# Patient Record
Sex: Female | Born: 1980 | Race: White | Hispanic: Yes | Marital: Married | Smoking: Never smoker
Health system: Southern US, Community
[De-identification: ages and names within clinical notes are randomized; demographics above are authoritative.]

## PROBLEM LIST (undated history)

## (undated) DIAGNOSIS — G8929 Other chronic pain: Secondary | ICD-10-CM

## (undated) DIAGNOSIS — Z789 Other specified health status: Secondary | ICD-10-CM

## (undated) HISTORY — DX: Other specified health status: Z78.9

## (undated) HISTORY — PX: NO PAST SURGERIES: SHX2092

## (undated) HISTORY — DX: Other chronic pain: G89.29

---

## 2008-02-05 ENCOUNTER — Ambulatory Visit (HOSPITAL_COMMUNITY): Admission: RE | Admit: 2008-02-05 | Discharge: 2008-02-05 | Payer: Self-pay | Admitting: Family Medicine

## 2008-07-06 ENCOUNTER — Ambulatory Visit (HOSPITAL_COMMUNITY): Admission: RE | Admit: 2008-07-06 | Discharge: 2008-07-06 | Payer: Self-pay | Admitting: Obstetrics & Gynecology

## 2008-07-11 ENCOUNTER — Ambulatory Visit: Payer: Self-pay | Admitting: Obstetrics and Gynecology

## 2008-07-11 ENCOUNTER — Inpatient Hospital Stay (HOSPITAL_COMMUNITY): Admission: AD | Admit: 2008-07-11 | Discharge: 2008-07-14 | Payer: Self-pay | Admitting: Obstetrics & Gynecology

## 2010-04-25 LAB — RPR: RPR Ser Ql: NONREACTIVE

## 2010-04-25 LAB — CBC
HCT: 39.1 % (ref 36.0–46.0)
Hemoglobin: 13.7 g/dL (ref 12.0–15.0)
MCV: 96 fL (ref 78.0–100.0)
MCV: 97.4 fL (ref 78.0–100.0)
RBC: 3.37 MIL/uL — ABNORMAL LOW (ref 3.87–5.11)
RBC: 4.07 MIL/uL (ref 3.87–5.11)
WBC: 14.9 10*3/uL — ABNORMAL HIGH (ref 4.0–10.5)
WBC: 7.9 10*3/uL (ref 4.0–10.5)

## 2011-09-25 ENCOUNTER — Ambulatory Visit: Payer: Self-pay | Admitting: Family Medicine

## 2011-09-25 VITALS — BP 98/56 | HR 90 | Temp 98.3°F | Resp 20 | Ht 63.0 in | Wt 124.0 lb

## 2011-09-25 DIAGNOSIS — S161XXA Strain of muscle, fascia and tendon at neck level, initial encounter: Secondary | ICD-10-CM

## 2011-09-25 DIAGNOSIS — S139XXA Sprain of joints and ligaments of unspecified parts of neck, initial encounter: Secondary | ICD-10-CM

## 2011-09-25 MED ORDER — CYCLOBENZAPRINE HCL 10 MG PO TABS: 10.0000 mg | ORAL_TABLET | Freq: Two times a day (BID) | ORAL | Status: AC | PRN

## 2011-09-25 NOTE — Progress Notes (Signed)
[facility] [address], [place] [address] [phone]  Date:  09/25/2011   Name:  Betty Bates   DOB:  [birth year]   MRN:  [id number]  PCP:  No primary provider on file.    Chief Complaint: Headache   History of Present Illness:  Betty Bates is a 31 y.o. very pleasant female patient who presents with the following:  She has noted pain in the back of her head for about 10 days.  She tried some OTC pain relievers, but these did not seem to help.  Her neck does not hurt.  She has not noted a cough, ST, or fever.  The symptoms wax and wane- she will notice return of her pain in the morning after she gets up.  The pain is not very intense, but it is persistent.   She has never had this before.  She has noted some pressure in her ears, but no changes in her vision or hearing.  No nausea or vomiting.    She has been "frustrated" as she has not been able to achieve orgasm with her BF for the last several months.  She wonders if this is contributing to her headache.    There is no problem list on file for this patient.   No past medical history on file.  No past surgical history on file.  History  Substance Use Topics  . Smoking status: Never Smoker   . Smokeless tobacco: Not on file  . Alcohol Use: Not on file    No family history on file.  Not on File  Medication list has been reviewed and updated.  No current outpatient prescriptions on file prior to visit.    Review of Systems:  As per HPI- otherwise negative.   Physical Examination: Filed Vitals:   09/25/11 1405  BP: 98/56  Pulse: 116  Temp: 98.3 F (36.8 C)  Resp: 20   Filed Vitals:   09/25/11 1405  Height: 5\' 3"  (1.6 m)  Weight: 124 lb (56.246 kg)   Body mass index is 21.97 kg/(m^2). Ideal Body Weight: Weight in (lb) to have BMI = 25: 140.8   GEN: WDWN, NAD, Non-toxic, A & O x 3, looks well HEENT: Atraumatic, Normocephalic. Neck supple. No masses, No LAD.   PEERL, EOMI. Oropharynx wnl, TM wnl bilaterally.  No nodes in her neck- anterior or posterior.  She is slightly tender at the muscles at the base of her skull- right more than left. Full cervical ROM is preserved.    Ears and Nose: No external deformity. CV: RRR, No M/G/R. No JVD. No thrill. No extra heart sounds. PULM: CTA B, no wheezes, crackles, rhonchi. No retractions. No resp. distress. No accessory muscle use. EXTR: No c/c/e NEURO Normal gait.   Normal strength, sensation and DTR of all extremities.  Negative arm drift.   PSYCH: Normally interactive. Conversant. Not depressed or anxious appearing.  Calm demeanor.    Assessment and Plan: 1. Neck strain  cyclobenzaprine (FLEXERIL) 10 MG tablet   Betty Bates seems to have a strain of the muscles in the back of her neck/ skull base.  Will try flexeril as needed- be cautious of sedation and avoid use while driving.  She is to let me know if she is not better in the next few days- Sooner if worse.   Discussed her other problems as well.  Try conservative therapy such as relaxation, etc first.   If not helpful we can try  hormone levels, but she has no insurance coverage so will defer for now.    Abbe Amsterdam, MD

## 2012-06-26 ENCOUNTER — Ambulatory Visit: Payer: Self-pay | Admitting: Physician Assistant

## 2012-06-26 VITALS — BP 102/62 | HR 82 | Temp 98.4°F | Resp 18 | Ht 63.0 in | Wt 130.0 lb

## 2012-06-26 DIAGNOSIS — N912 Amenorrhea, unspecified: Secondary | ICD-10-CM

## 2012-06-26 LAB — POCT URINE PREGNANCY: Preg Test, Ur: POSITIVE

## 2012-06-26 NOTE — Progress Notes (Signed)
  Subjective:    Patient ID: Betty Bates, female    DOB: 11/06/1980, 32 y.o.   MRN: [id number]  HPI   Betty Bates is a very pleasant 32 yr old female here requesting a pregnancy test.  Had a positive home pregnancy test about a month and a half ago.  Needs letter with pos result and EDC.  States that she was trying to conceive.  Does not have OB/GYN at this point, but trying to establish.  Thinks LMP 04/12/12, but periods are irregular so she is not sure.  1 prior pregnancy in 2010 with no complications.  She does not smoke and is not currently drinking alcohol.  She is taking prenatal vitamins daily.  Endorses some nausea, heartburn, and fatigue, but otherwise is feeling well.  Denies vaginal discharge or bleeding.     Review of Systems  Constitutional: Positive for fatigue. Negative for fever, chills and appetite change.  HENT: Negative.   Respiratory: Negative.   Cardiovascular: Negative.   Gastrointestinal: Positive for nausea. Negative for vomiting.  Genitourinary: Positive for menstrual problem (amenorrhea). Negative for vaginal bleeding and vaginal discharge.  Musculoskeletal: Negative.   Skin: Negative.   Neurological: Negative.        Objective:   Physical Exam  Vitals reviewed. Constitutional: She is oriented to person, place, and time. She appears well-developed and well-nourished. No distress.  HENT:  Head: Normocephalic and atraumatic.  Eyes: Conjunctivae are normal. No scleral icterus.  Cardiovascular: Normal rate, regular rhythm and normal heart sounds.   Pulmonary/Chest: Effort normal and breath sounds normal. She has no wheezes. She has no rales.  Abdominal: Soft. There is no tenderness.  Neurological: She is alert and oriented to person, place, and time.  Skin: Skin is warm and dry.  Psychiatric: She has a normal mood and affect. Her behavior is normal.     Results for orders placed in visit on 06/26/12  POCT URINE PREGNANCY      Result Value  Range   Preg Test, Ur Positive         Assessment & Plan:  Amenorrhea - Plan: POCT urine pregnancy   Betty Bates is a very pleasant 32 yr old female here with amenorrhea.  LMP estimated to be 04/12/12.  Urine preg pos today in clinic.  Estimated to be 10wks 5days by dates, making [facility] 01/17/13.  Anticipatory guidance.  Encouraged pt to continue prenatal vitamins, healthy dietary choices.  Avoid tobacco, etoh.  RTC precautions regarding bleeding, pain.  Encouraged pt to establish with OB/GYN within the next 2-3 wks for routine prenatal care.  Letter provided with lab results and EDC
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
## 2012-07-11 ENCOUNTER — Other Ambulatory Visit: Payer: Self-pay

## 2012-07-11 DIAGNOSIS — Z331 Pregnant state, incidental: Secondary | ICD-10-CM

## 2012-07-11 NOTE — Progress Notes (Signed)
PRENATAL LABS DONE TODAY Avantika Shere 

## 2012-07-12 LAB — OBSTETRIC PANEL
Antibody Screen: NEGATIVE
Basophils Relative: 0 % (ref 0–1)
HCT: 39.7 % (ref 36.0–46.0)
Hemoglobin: 13.5 g/dL (ref 12.0–15.0)
Lymphocytes Relative: 16 % (ref 12–46)
MCHC: 34 g/dL (ref 30.0–36.0)
Monocytes Absolute: 0.6 10*3/uL (ref 0.1–1.0)
Monocytes Relative: 6 % (ref 3–12)
Neutro Abs: 7.4 10*3/uL (ref 1.7–7.7)
Rh Type: POSITIVE
Rubella: 23 Index — ABNORMAL HIGH (ref <0.90)

## 2012-07-18 ENCOUNTER — Encounter: Payer: Self-pay | Admitting: Family Medicine

## 2012-07-18 ENCOUNTER — Ambulatory Visit (INDEPENDENT_AMBULATORY_CARE_PROVIDER_SITE_OTHER): Payer: Self-pay | Admitting: Family Medicine

## 2012-07-18 VITALS — BP 112/68 | Wt 130.0 lb

## 2012-07-18 DIAGNOSIS — Z3480 Encounter for supervision of other normal pregnancy, unspecified trimester: Secondary | ICD-10-CM

## 2012-07-18 NOTE — Progress Notes (Signed)
32 year old G2P1001 @ 15w 0d by LMP who presents for initial OB visit. Complains of lower back pain, nausea, and intermittent headache today.   O: Blood pressure 112/68, weight 130 lb (58.968 kg), last menstrual period 04/12/2012. Gen: middle aged Hispanic female, well appearing, English speaking HEENT: NCAT, PERRLA CV: RRR, no murmurs Pulm: CTA-B Abd: soft, ND Extremities: no edema MSK: negative straight leg raise and Faber test's bilaterally, no tenderness of spine  GU: External: no lesions Vagina: no blood in vault Cervix: no lesion; no mucopurulent d/c Uterus: small, mobile Adnexa: no masses; non tender   PHQ-9: 57  A/P: 32 year old G2P1001 at first prenatal visit - No indication for early glucola - Concern about exposure to herpes, so will test HSV2 Ab, encouarged abstinence with husband and counseled on TORCH infections - Uncertain of varicella immunity, so check titer  - Genetic screening: draw quad screen - F/u in 4 weeks

## 2012-07-18 NOTE — Patient Instructions (Signed)
Thank you coming to see me today. I am excited to be your doctor.   For you nausea, I recommend ginger tablets or vitamin B6 tabs. These can be purchased at any pharmacy.   For back ache, take tylenol, using a heating pad and rest when possible.   For headaches, tylenol and drinking lots of water will help.   Come back in 2 weeks for a lab visit. Come back in 4 weeks for a visit with me.   Sincerely,   Dr. Clinton Sawyer [phone]

## 2012-07-23 ENCOUNTER — Encounter: Payer: Self-pay | Admitting: Family Medicine

## 2012-08-01 ENCOUNTER — Ambulatory Visit (INDEPENDENT_AMBULATORY_CARE_PROVIDER_SITE_OTHER): Payer: Self-pay | Admitting: Family Medicine

## 2012-08-01 VITALS — BP 101/66 | Wt 133.0 lb

## 2012-08-01 DIAGNOSIS — Z3480 Encounter for supervision of other normal pregnancy, unspecified trimester: Secondary | ICD-10-CM

## 2012-08-01 NOTE — Progress Notes (Signed)
32 year old G2P1001 @ [redacted]w[redacted]d EGA by LMP (which is very uncertain dating) who presents for a lab visit. She is having no problems and does not need to be seen for another two weeks. Today we will obtain quad screen, HSV2 ab, and varicella titer.

## 2012-08-07 NOTE — Progress Notes (Signed)
Chart and labs reviewed, I agree with Dr Yetta Numbers assess/plan. Patient with prior delivery just shy of LGA (3.9kg); decision to defer 1hrGTT until 24-[redacted] weeks EGA unless other indication (ie, first degree relative with DM).  Anatomy scan at 18-20 weeks. JB

## 2012-08-08 ENCOUNTER — Encounter: Payer: Self-pay | Admitting: Family Medicine

## 2012-08-08 DIAGNOSIS — Z3490 Encounter for supervision of normal pregnancy, unspecified, unspecified trimester: Secondary | ICD-10-CM | POA: Insufficient documentation

## 2012-08-19 ENCOUNTER — Ambulatory Visit (INDEPENDENT_AMBULATORY_CARE_PROVIDER_SITE_OTHER): Payer: Self-pay | Admitting: Family Medicine

## 2012-08-19 VITALS — BP 94/64 | Wt 136.5 lb

## 2012-08-19 DIAGNOSIS — Z3480 Encounter for supervision of other normal pregnancy, unspecified trimester: Secondary | ICD-10-CM

## 2012-08-19 DIAGNOSIS — Z3492 Encounter for supervision of normal pregnancy, unspecified, second trimester: Secondary | ICD-10-CM

## 2012-08-19 NOTE — Patient Instructions (Signed)
Thank you for coming in today. Please call back tomorrow or Wednesday for a referral for the ultrasound, depending on whether you receive the orange card. Also, please look at the website below for any interest in baby classes.   [email or web address]  At your next visit, please follow up in the [facility] clinic with Dr. Mauricio Po or Dr. Lum Babe. This will be in 4 weeks. Then follow with me afterwards.   Sincerely,   Dr. Clinton Sawyer

## 2012-08-19 NOTE — Progress Notes (Signed)
32 year old G2P1001 @ [redacted]w[redacted]d who present for routine prenatal care.   O: see vitals A/P:  - Patient deferred ultrasound until application for orange care completed, which is tomorrow, she will call back if appt for u/s at Dr. Elsie Stain office needed - Discussed prenatal classes and encouraged exercise - F/u in 4 weeks with OB clinic

## 2012-08-23 ENCOUNTER — Telehealth: Payer: Self-pay | Admitting: Family Medicine

## 2012-08-23 DIAGNOSIS — Z3492 Encounter for supervision of normal pregnancy, unspecified, second trimester: Secondary | ICD-10-CM

## 2012-08-23 NOTE — Telephone Encounter (Signed)
Pt was told by Dr. Clinton Sawyer to choose a place to have here ultra sound done. She would like to go to [facility] and would like to appointment on 8/26 if possible. JW

## 2012-08-23 NOTE — Telephone Encounter (Signed)
Will fwd to PCP for order. Lorenda Hatchet, Renato Battles

## 2012-08-26 NOTE — Telephone Encounter (Signed)
Attempted to call patient about ultrasound since it would be ideal to have it performed before 09/10/12. Also, it would be less expensive to go to Dr. Elsie Stain office.

## 2012-08-29 NOTE — Addendum Note (Signed)
Addended by: Garnetta Buddy on: 08/29/2012 08:52 AM   Modules accepted: Orders

## 2012-08-29 NOTE — Telephone Encounter (Signed)
Patient said she is going to get her Erskine Emery Card next week and would like an appointment for an ultrasound at [facility] after 8/22 in the afternoon. I will place the order and have our CMA [facility] to [facility] up and then let the patient know the time and date.

## 2012-09-02 ENCOUNTER — Ambulatory Visit: Payer: Self-pay

## 2012-09-09 ENCOUNTER — Ambulatory Visit (HOSPITAL_COMMUNITY)
Admission: RE | Admit: 2012-09-09 | Discharge: 2012-09-09 | Disposition: A | Discharge status: Home / Self Care | Payer: Self-pay | Source: Ambulatory Visit | Attending: Family Medicine | Admitting: Family Medicine

## 2012-09-09 ENCOUNTER — Encounter: Payer: Self-pay | Admitting: Family Medicine

## 2012-09-09 ENCOUNTER — Telehealth: Payer: Self-pay | Admitting: Family Medicine

## 2012-09-09 DIAGNOSIS — Z3492 Encounter for supervision of normal pregnancy, unspecified, second trimester: Secondary | ICD-10-CM

## 2012-09-09 DIAGNOSIS — Z3689 Encounter for other specified antenatal screening: Secondary | ICD-10-CM | POA: Insufficient documentation

## 2012-09-09 NOTE — Telephone Encounter (Signed)
Called patient and discussed results of ultrasound. She acknowledged understanding.

## 2012-09-26 ENCOUNTER — Ambulatory Visit (INDEPENDENT_AMBULATORY_CARE_PROVIDER_SITE_OTHER): Payer: No Typology Code available for payment source | Admitting: Family Medicine

## 2012-09-26 VITALS — BP 96/65 | Wt 141.9 lb

## 2012-09-26 DIAGNOSIS — Z3492 Encounter for supervision of normal pregnancy, unspecified, second trimester: Secondary | ICD-10-CM

## 2012-09-26 DIAGNOSIS — Z23 Encounter for immunization: Secondary | ICD-10-CM

## 2012-09-26 DIAGNOSIS — Z3480 Encounter for supervision of other normal pregnancy, unspecified trimester: Secondary | ICD-10-CM

## 2012-09-26 LAB — GLUCOSE, CAPILLARY
Comment 1: 1
Glucose-Capillary: 140 mg/dL — ABNORMAL HIGH (ref 70–99)

## 2012-09-26 NOTE — Progress Notes (Signed)
Patient seen in [facility], visit conducted in both [place] and [place].  Patient is G2P1 @[redacted]w[redacted]d , with EDD of 01/09/2013.  She reports ample fetal movement, denies LOF, denies vaginal discharge or bleeding.  She gets leg cramps at night sometimes, which are better with stretching.  Works on her feet as a Conservation officer, nature.  Nonsmoker.  Plans to breastfeed.  Has had very rare mild contractions from time to time. Family Hx: Paternal uncles with DM, no other family with DM.  Assess/Plan: To have 1hGTT today; we are giving flu shot today as well. Will need Tdap after 27 weeks' gestation.  Counseled on preterm labor, kick counts. Next visit in 3-4 weeks, then every 2 weeks from 28-36 weeks, then weekly.  Paula Compton, MD

## 2012-09-26 NOTE — Patient Instructions (Addendum)
Fue un Research officer, trade union en OB Clinic.  Esta' con 25 semanas de embarazo hoy.   Estamos haciendo la prueba del IT consultant.    [phone]  Proxima cita en 4 semanas con el Dr Clinton Sawyer. FOLLOW UP PRENATAL VISIT IN 4 WEEKS WITH DR Clinton Sawyer.   Prevencin de Sport and exercise psychologist (Preventing Preterm Labor) Un parto prematuro ocurre cuando la mujer embarazada tiene contracciones uterinas que causan la apertura, el acortamiento y el afinamiento del cuello del tero, antes de las 37 semanas de [place]. Tendr contracciones regulares cada 2 a 3 minutos. Esto generalmente causa molestias o dolor. CUIDADOS EN EL HOGAR  Consuma una dieta saludable.  [employer] las vitaminas segn le haya indicado el mdico.  Beba una cantidad de lquido suficiente como para Pharmacologist la orina de tono claro o color amarillo plido todos [place].  Descanse y duerma.  No tenga relaciones sexuales si tiene un parto prematuro o alto riesgo de tenerlo.  Siga las instrucciones del mdico acerca de su [place], los medicamentos y los exmenes.  Evite el estrs.  Evite los esfuerzos extenuantes o la actividad fsica extensa.  No fume. SOLICITE AYUDA DE INMEDIATO SI:   Tiene contracciones.  Siente dolor abdominal.  Tiene sangrado que proviene de la vagina.  Siente dolor al [employer].  Observa una secrecin anormal que proviene de la vagina.  Tiene una temperatura oral de ms de 38,9 C (102 F). ASEGRESE DE QUE:  Comprende estas instrucciones.  Controlar su enfermedad.  Solicitar ayuda si no mejora o si empeora. Document Released: 02/04/2010 Document Revised: 03/27/2011 [facility] Patient Information 2014 [place], [place].

## 2012-10-02 ENCOUNTER — Other Ambulatory Visit (INDEPENDENT_AMBULATORY_CARE_PROVIDER_SITE_OTHER): Payer: No Typology Code available for payment source

## 2012-10-02 DIAGNOSIS — Z331 Pregnant state, incidental: Secondary | ICD-10-CM

## 2012-10-02 NOTE — Progress Notes (Signed)
3 HR GTT DONE TODAY Alla Sloma 

## 2012-10-03 LAB — GLUCOSE TOLERANCE, 3 HOURS
Glucose Tolerance, 1 hour: 104 mg/dL (ref 70–189)
Glucose Tolerance, Fasting: 79 mg/dL (ref 70–104)

## 2012-10-23 ENCOUNTER — Ambulatory Visit (INDEPENDENT_AMBULATORY_CARE_PROVIDER_SITE_OTHER): Payer: No Typology Code available for payment source | Admitting: Family Medicine

## 2012-10-23 ENCOUNTER — Encounter: Payer: No Typology Code available for payment source | Admitting: Family Medicine

## 2012-10-23 VITALS — BP 105/72 | Wt 144.0 lb

## 2012-10-23 DIAGNOSIS — Z3400 Encounter for supervision of normal first pregnancy, unspecified trimester: Secondary | ICD-10-CM

## 2012-10-23 DIAGNOSIS — Z3403 Encounter for supervision of normal first pregnancy, third trimester: Secondary | ICD-10-CM

## 2012-10-23 DIAGNOSIS — Z3480 Encounter for supervision of other normal pregnancy, unspecified trimester: Secondary | ICD-10-CM

## 2012-10-23 DIAGNOSIS — Z3493 Encounter for supervision of normal pregnancy, unspecified, third trimester: Secondary | ICD-10-CM

## 2012-10-23 DIAGNOSIS — Z331 Pregnant state, incidental: Secondary | ICD-10-CM

## 2012-10-23 MED ORDER — TETANUS-DIPHTH-ACELL PERTUSSIS 5-2.5-18.5 LF-MCG/0.5 IM SUSP: 0.5000 mL | Freq: Once | INTRAMUSCULAR | Status: DC

## 2012-10-23 NOTE — Patient Instructions (Signed)
I am glad that you are doing well. The baby seems very healthy today.   Please go to [facility] for the following reasons:   You do not have 5 or more contraction in one hours Bleeding or a large amount of fluid coming from you vagina You do not feel the baby move more than 10 times in 2 hours   Please follow up in 2 weeks.   Sincerely,   Dr. Clinton Sawyer

## 2012-10-23 NOTE — Progress Notes (Signed)
32 year old F G2P1001 @ [redacted]w[redacted]d by LMP who presents for rountine prenatal care. No complaints.   Vitals - see flow sheet, all WNL A/P: - Given TDAP today - Check CBC, RPR, and HIV today - Counseled on preterm labor precautions - F/u in 2 weeks

## 2012-10-24 ENCOUNTER — Encounter: Payer: Self-pay | Admitting: Family Medicine

## 2012-10-24 LAB — CBC
HCT: 35.9 % — ABNORMAL LOW (ref 36.0–46.0)
Hemoglobin: 12.1 g/dL (ref 12.0–15.0)
MCH: 29.2 pg (ref 26.0–34.0)
MCHC: 33.7 g/dL (ref 30.0–36.0)

## 2012-11-06 ENCOUNTER — Encounter: Payer: No Typology Code available for payment source | Admitting: Family Medicine

## 2012-11-06 ENCOUNTER — Ambulatory Visit (INDEPENDENT_AMBULATORY_CARE_PROVIDER_SITE_OTHER): Payer: No Typology Code available for payment source | Admitting: Family Medicine

## 2012-11-06 VITALS — BP 100/66 | Wt 145.5 lb

## 2012-11-06 DIAGNOSIS — Z3493 Encounter for supervision of normal pregnancy, unspecified, third trimester: Secondary | ICD-10-CM

## 2012-11-06 DIAGNOSIS — Z3480 Encounter for supervision of other normal pregnancy, unspecified trimester: Secondary | ICD-10-CM

## 2012-11-06 NOTE — Patient Instructions (Signed)
It was nice to see you today. Everything looks great. Please follow up in two weeks .  Please go to [facility] for the following reasons:   You do have 5 or more contraction in one hour Bleeding or a large amount of fluid coming from you vagina You do not feel the baby move more than 10 times in 2 hours  Sincerely,   Dr. Clinton Sawyer

## 2012-11-06 NOTE — Progress Notes (Signed)
32 year old F G2P1001 @ [redacted]w[redacted]d by LMP presents for routine prenatal today.  O: see flow sheet A/P: - Discussed negative labs from last visit - Patient denies wanting contraception after pregnancy - Plans to breast feed - F/u in 2 weeks

## 2012-11-20 ENCOUNTER — Ambulatory Visit (INDEPENDENT_AMBULATORY_CARE_PROVIDER_SITE_OTHER): Payer: No Typology Code available for payment source | Admitting: Family Medicine

## 2012-11-20 VITALS — BP 97/66 | Temp 98.1°F | Wt 147.0 lb

## 2012-11-20 DIAGNOSIS — Z3483 Encounter for supervision of other normal pregnancy, third trimester: Secondary | ICD-10-CM

## 2012-11-20 DIAGNOSIS — Z3480 Encounter for supervision of other normal pregnancy, unspecified trimester: Secondary | ICD-10-CM

## 2012-11-20 NOTE — Patient Instructions (Addendum)
Dear Betty Bates,   It was nice to see you today. Everything seems to be going very well with her pregnancy. Please be very cautious about having sex with her husband and please do not do so he has active herpes lesions. Condoms or a good choice but they are not 100% effective. If you develop active herpes towards anterior pregnancy that you will need a C-section.   Please schedule a visit with the [facility] clinic [facility] for [facility].  Please go to [facility] for the following reasons:   You do not have 5 or more contraction in one hours Bleeding or a large amount of fluid coming from you vagina You do not feel the baby move more than 10 times in 2 hours  Sincerely,  Dr. Clinton Sawyer

## 2012-11-20 NOTE — Progress Notes (Signed)
32 year old G2P1001 @ [redacted]w[redacted]d by LMP who presents for routine prenatal care. Concern for labial lesion.   O: BP 97/66  Temp(Src) 98.1 F (36.7 C)  Wt 147 lb (66.679 kg)  LMP 04/04/2012 Labial Exam: dilated vein of left labia major, no other lesions or cysts  A/P:  - Given precautions for early labor and fetal movement - Return to clinic to prenatal clinic with Dr. Mauricio Po on Dr. Lum Babe (needs GBS, GC, CT @ 36 w) - Continue to check labial lesion since husband with a hx of genital herpes (pt tested negative for HSV2 in beginning of pregnancy)

## 2012-12-05 ENCOUNTER — Ambulatory Visit (INDEPENDENT_AMBULATORY_CARE_PROVIDER_SITE_OTHER): Payer: No Typology Code available for payment source | Admitting: Family Medicine

## 2012-12-05 VITALS — BP 107/72 | Temp 98.2°F | Wt 149.8 lb

## 2012-12-05 DIAGNOSIS — Z3493 Encounter for supervision of normal pregnancy, unspecified, third trimester: Secondary | ICD-10-CM

## 2012-12-05 DIAGNOSIS — Z3480 Encounter for supervision of other normal pregnancy, unspecified trimester: Secondary | ICD-10-CM

## 2012-12-05 NOTE — Progress Notes (Signed)
32 Y/O F G2P1001 @ 35w 0 day GA,here for routine prenatal care, denies any major concern,feels baby move a lot more in the last few days.Hx of possible vulva lesion, will like to recheck today, denies vaginal bleeding, she has thin vaginal discharge with no change in color, no urinary symptom.  Exam: Check flow sheet. In addition CV: S1 S2 normal,no murmurs.                  Resp: Air entry equal B/L and adequate with no abnormal respiratory sounds. Abd: FH: 33cm, FH: 150 as documented. Bed side U/S showed cerphalic presentation with normal heart movement. GU; No vulva lesion  A/P:  Normal progression of pregnancy.         Up to date with blood work and vaccination.         Pap test and U/S reviewed.         Counseling done on Breast feeding, fetal kick count.         Counseling done on preterm labor.         RTC in 1 wk for GBC,GC and Chlamydia at 36 wks.

## 2012-12-05 NOTE — Patient Instructions (Signed)
Prenatal Care  WHAT IS PRENATAL CARE?  Prenatal care means health care during your pregnancy, before your baby is born. Take care of yourself and your baby by:   Getting early prenatal care. If you know you are pregnant, or think you might be pregnant, call your caregiver as soon as possible. Schedule a visit for a general/prenatal examination.  Getting regular prenatal care. Follow your caregiver's schedule for blood and other necessary tests. Do not miss appointments.  Do everything you can to keep yourself and your baby healthy during your pregnancy.  Prenatal care should include evaluation of medical, dietary, educational, psychological, and social needs for the couple and the medical, surgical, and genetic history of the family of the mother and father.  Discuss with your caregiver:  Your medicines, prescription, over-the-counter, and herbal medicines.  Substance abuse, alcohol, smoking, and illegal drugs.  Domestic abuse and violence, if present.  Your immunizations.  Nutrition and diet.  Exercising.  Environment and occupational hazards, at home and at work.  History of sexually transmitted disease, both you and your partner.  Previous pregnancies. WHY IS PRENATAL CARE SO IMPORTANT?  By seeing you regularly, your caregiver has the chance to find problems early, so that they can be treated as soon as possible. Other problems might be prevented. Many studies have shown that early and regular prenatal care is important for the health of both mothers and their babies.  I AM THINKING ABOUT GETTING PREGNANT. HOW CAN I TAKE CARE OF MYSELF?  Taking care of yourself before you get pregnant helps you to have a healthy pregnancy. It also lowers your chances of having a baby born with a birth defect. Here are ways to take care of yourself before you get pregnant:   Eat healthy foods, exercise regularly (30 minutes per day for most days of the week is best), and get enough rest and  sleep. Talk to your caregiver about what kinds of foods and exercises are best for you.  Take 400 micrograms (mcg) of folic acid (one of the B vitamins) every day. The best way to do this is to take a daily multivitamin pill that contains this amount of folic acid. Getting enough of the synthetic (manufactured) form of folic acid every day before you get pregnant and during early pregnancy can help prevent certain birth defects. Many breakfast cereals and other grain products have folic acid added to them, but only certain cereals contain 400 mcg of folic acid per serving. Check the label on your multivitamin or cereal to find the amount of folic acid in the food.  See your caregiver for a complete check up before getting pregnant. Make sure that you have had all your immunization shots, especially for rubella (German measles). Rubella can cause serious birth defects. Chickenpox is another illness you want to avoid during pregnancy. If you have had chickenpox and rubella in the past, you should be immune to them.  Tell your caregiver about any prescription or non-prescription medicines (including herbal remedies) you are taking. Some medicines are not safe to take during pregnancy.  Stop smoking cigarettes, drinking alcohol, or taking illegal drugs. Ask your caregiver for help, if you need it. You can also get help with alcohol and drugs by talking with a member of your faith community, a counselor, or a trusted friend.  Discuss and treat any medical, social, or psychological problems before getting pregnant.  Discuss any history of genetic problems in the mother, father, and their families. Do   genetic testing before getting pregnant, when possible.  Discuss any physical or emotional abuse with your caregiver.  Discuss with your caregiver if you might be exposed to harmful chemicals on your job or where you live.  Discuss with your caregiver if you think your job or the hours you work may be  harmful and should be changed.  The father should be involved with the decision making and with all aspects of the pregnancy, labor, and delivery.  If you have medical insurance, make sure you are covered for pregnancy. I JUST FOUND OUT THAT I AM PREGNANT. HOW CAN I TAKE CARE OF MYSELF?  Here are ways to take care of yourself and the precious new life growing inside you:   Continue taking your multivitamin with 400 micrograms (mcg) of folic acid every day.  Get early and regular prenatal care. It does not matter if this is your first pregnancy or if you already have children. It is very important to see a caregiver during your pregnancy. Your caregiver will check at each visit to make sure that you and the baby are healthy. If there are any problems, action can be taken right away to help you and the baby.  Eat a healthy diet that includes:  Fruits.  Vegetables.  Foods low in saturated fat.  Grains.  Calcium-rich foods.  Drink 6 to 8 glasses of liquids a day.  Unless your caregiver tells you not to, try to be physically active for 30 minutes, most days of the week. If you are pressed for time, you can get your activity in through 10 minute segments, three times a day.  If you smoke, drink alcohol, or use drugs, STOP. These can cause long-term damage to your baby. Talk with your caregiver about steps to take to stop smoking. Talk with a member of your faith community, a counselor, a trusted friend, or your caregiver if you are concerned about your alcohol or drug use.  Ask your caregiver before taking any medicine, even over-the-counter medicines. Some medicines are not safe to take during pregnancy.  Get plenty of rest and sleep.  Avoid hot tubs and saunas during pregnancy.  Do not have X-rays taken, unless absolutely necessary and with the recommendation of your caregiver. A lead shield can be placed on your abdomen, to protect the baby when X-rays are taken in other parts of the  body.  Do not empty the cat litter when you are pregnant. It may contain a parasite that causes an infection called toxoplasmosis, which can cause birth defects. Also, use gloves when working in garden areas used by cats.  Do not eat uncooked or undercooked cheese, meats, or fish.  Stay away from toxic chemicals like:  Insecticides.  Solvents (some cleaners or paint thinners).  Lead.  Mercury.  Sexual relations may continue until the end of the pregnancy, unless you have a medical problem or there is a problem with the pregnancy and your caregiver tells you not to.  Do not wear high heel shoes, especially during the second half of the pregnancy. You can lose your balance and fall.  Do not take long trips, unless absolutely necessary. Be sure to see your caregiver before going on the trip.  Do not sit in one position for more than 2 hours, when on a trip.  Take a copy of your medical records when going on a trip.  Know where there is a hospital in the city you are visiting, in case of an   emergency.  Most dangerous household products will have pregnancy warnings on their labels. Ask your caregiver about products if you are unsure.  Limit or eliminate your caffeine intake from coffee, tea, sodas, medicines, and chocolate.  Many women continue working through pregnancy. Staying active might help you stay healthier. If you have a question about the safety or the hours you work at your particular job, talk with your caregiver.  Get informed:  Read books.  Watch videos.  Go to childbirth classes for you and the father.  Talk with experienced moms.  Ask your caregiver about childbirth education classes for you and your partner. Classes can help you and your partner prepare for the birth of your baby.  Ask about a pediatrician (baby doctor) and methods and pain medicine for labor, delivery, and possible Cesarean delivery (C-section). I AM NOT THINKING ABOUT GETTING PREGNANT  RIGHT NOW, BUT HEARD THAT ALL WOMEN SHOULD TAKE FOLIC ACID EVERY DAY?  All women of childbearing age, with even a remote chance of getting pregnant, should try to make sure they get enough folic acid. Many pregnancies are not planned. Many women do not know they are actually pregnant early in their pregnancies, and certain birth defects happen in the very early part of pregnancy. Taking 400 micrograms (mcg) of folic acid every day will help prevent certain birth defects that happen in the early part of pregnancy. If a woman begins taking vitamin pills in the second or third month of pregnancy, it may be too late to prevent birth defects. Folic acid may also have other health benefits for women, besides preventing birth defects.  HOW OFTEN SHOULD I SEE MY CAREGIVER DURING PREGNANCY?  Your caregiver will give you a schedule for your prenatal visits. You will have visits more often as you get closer to the end of your pregnancy. An average pregnancy lasts about 40 weeks.  A typical schedule includes visiting your caregiver:   About once each month, during your first 6 months of pregnancy.  Every 2 weeks, during the next 2 months.  Weekly in the last month, until the delivery date. Your caregiver will probably want to see you more often if:  You are over 35.  Your pregnancy is high risk, because you have certain health problems or problems with the pregnancy, such as:  Diabetes.  High blood pressure.  The baby is not growing on schedule, according to the dates of the pregnancy. Your caregiver will do special tests, to make sure you and the baby are not having any serious problems. WHAT HAPPENS DURING PRENATAL VISITS?   At your first prenatal visit, your caregiver will talk to you about you and your partner's health history and your family's health history, and will do a physical exam.  On your first visit, a physical exam will include checks of your blood pressure, height and weight, and an  exam of your pelvic organs. Your caregiver will do a Pap test if you have not had one recently, and will do cultures of your cervix to make sure there is no infection.  At each visit, there will be tests of your blood, urine, blood pressure, weight, and checking the progress of the baby.  Your caregiver will be able to tell you when to expect that your baby will be born.  Each visit is also a chance for you to learn about staying healthy during pregnancy and for asking questions.  Discuss whether you will be breastfeeding.  At your later prenatal   visits, your caregiver will check how you are doing and how the baby is developing. You may have a number of tests done as your pregnancy progresses.  Ultrasound exams are often used to check on the baby's growth and health.  You may have more urine and blood tests, as well as special tests, if needed. These may include amniocentesis (examine fluid in the pregnancy sac), stress tests (check how baby responds to contractions), biophysical profile (measures fetus well-being). Your caregiver will explain the tests and why they are necessary. I AM IN MY LATE THIRTIES, AND I WANT TO HAVE A CHILD NOW. SHOULD I DO ANYTHING SPECIAL?  As you get older, there is more chance of having a medical problem (high blood pressure), pregnancy problem (preeclampsia, problems with the placenta), miscarriage, or a baby born with a birth defect. However, most women in their late thirties and early forties have healthy babies. See your caregiver on a regular basis before you get pregnant and be sure to go for exams throughout your pregnancy. Your caregiver probably will want to do some special tests to check on you and your baby's health when you are pregnant.  Women today are often delaying having children until later in life, when they are in their thirties and forties. While many women in their thirties and forties have no difficulty getting pregnant, fertility does decline  with age. For women over 40 who cannot get pregnant after 6 months of trying, it is recommended that they see their caregiver for a fertility evaluation. It is not uncommon to have trouble becoming pregnant or experience infertility (inability to become pregnant after trying for one year). If you think that you or your partner may be infertile, you can discuss this with your caregiver. He or she can recommend treatments such as drugs, surgery, or assisted reproductive technology.  Document Released: 01/05/2003 Document Revised: 03/27/2011 Document Reviewed: 06/20/2012 [employer] Patient Information 2014 [employer].  

## 2012-12-10 ENCOUNTER — Ambulatory Visit (INDEPENDENT_AMBULATORY_CARE_PROVIDER_SITE_OTHER): Payer: No Typology Code available for payment source | Admitting: Family Medicine

## 2012-12-10 ENCOUNTER — Other Ambulatory Visit (HOSPITAL_COMMUNITY)
Admission: RE | Admit: 2012-12-10 | Discharge: 2012-12-10 | Disposition: A | Discharge status: Home / Self Care | Payer: No Typology Code available for payment source | Source: Ambulatory Visit | Attending: Family Medicine | Admitting: Family Medicine

## 2012-12-10 VITALS — BP 95/65 | Wt 150.5 lb

## 2012-12-10 DIAGNOSIS — Z3493 Encounter for supervision of normal pregnancy, unspecified, third trimester: Secondary | ICD-10-CM

## 2012-12-10 DIAGNOSIS — Z3480 Encounter for supervision of other normal pregnancy, unspecified trimester: Secondary | ICD-10-CM

## 2012-12-10 DIAGNOSIS — Z113 Encounter for screening for infections with a predominantly sexual mode of transmission: Secondary | ICD-10-CM | POA: Insufficient documentation

## 2012-12-10 NOTE — Patient Instructions (Signed)
It was great to see you today.    Please go to [facility] for the following reasons:   You do not have 5 or more contraction in one hours Bleeding or a large amount of fluid coming from you vagina You do not feel the baby move more than 10 times in 2 hours  Please come back next week.   Sincerely,   Dr. Clinton Sawyer

## 2012-12-10 NOTE — Progress Notes (Signed)
32 year old G2P1001 @ [redacted]w[redacted]d by LMP presents for routine care. She denies any complaints at this time.  Exam: Check flow sheet  Assessment and plan: normal progression of pregnancy - obtained group B strep, gonorrhea and Chlamydia swabs today; otherwise up-to-date on all cranial care - Patient has already received counseling breast-feeding - Patient has indications for presenting to the hospital for evaluation of possible onset of labor - Follow up in one week

## 2012-12-12 ENCOUNTER — Encounter: Payer: Self-pay | Admitting: Family Medicine

## 2012-12-18 ENCOUNTER — Encounter: Payer: No Typology Code available for payment source | Admitting: Emergency Medicine

## 2012-12-18 ENCOUNTER — Ambulatory Visit (INDEPENDENT_AMBULATORY_CARE_PROVIDER_SITE_OTHER): Payer: No Typology Code available for payment source | Admitting: Family Medicine

## 2012-12-18 VITALS — BP 101/63 | Wt 152.0 lb

## 2012-12-18 DIAGNOSIS — Z3480 Encounter for supervision of other normal pregnancy, unspecified trimester: Secondary | ICD-10-CM

## 2012-12-18 DIAGNOSIS — Z3493 Encounter for supervision of normal pregnancy, unspecified, third trimester: Secondary | ICD-10-CM

## 2012-12-18 NOTE — Progress Notes (Signed)
Betty Bates is a 32 y.o. G2P1001 at [redacted]w[redacted]d for routine follow up.  She reports irregular contractions and mild L sided pelvic/hip pain.  See flow sheet for details.  A/P: Pregnancy at [redacted]w[redacted]d.  Doing well.     Infant feeding choiceBreast Contraception choice Condoms Infant circumcision desired not applicable  Tdapwas not given today. GBS/GC/CZ testing was not performed today, GBS negative from last week  Preterm labor precautions reviewed. Follow up 1 weeks.

## 2012-12-18 NOTE — Patient Instructions (Signed)
Tercer trimestre del embarazo  (Third Trimester of Pregnancy)  El tercer trimestre del embarazo abarca desde la semana 29 hasta la semana 42, desde el 7 mes hasta el 9. En este trimestre el beb (feto) se desarrolla muy rpidamente. Hacia el final del noveno mes, el beb que an no ha nacido mide alrededor de 20 pulgadas (45 cm) de largo. Y pesa entre 6 y 10 libras (2,700 y 4,500 kg).  CUIDADOS EN EL HOGAR   Evite fumar, consumir hierbas y beber alcohol. Evite los frmacos que no apruebe el mdico.  Slo tome los medicamentos que le haya indicado su mdico. Algunos medicamentos son seguros para tomar durante el embarazo y otros no lo son.  Haga ejercicios slo como le indique el mdico. Deje de hacer ejercicios si comienza a tener clicos.  Haga comidas regulares y sanas.  Use un sostn que le brinde buen soporte si sus mamas estn sensibles.  No utilice la baera con agua caliente, baos turcos y saunas.  Colquese el cinturn de seguridad cuando conduzca.  Evite comer carne cruda y el contacto con los utensilios y desperdicios de los gatos.  Tome las vitaminas indicadas para la etapa prenatal.  Trate de tomar medicamentos para mover el intestino (laxantes) segn lo necesario y si su mdico la autoriza. Consuma ms fibra comiendo frutas y vegetales frescos y granos enteros. Beba gran cantidad de lquido para mantener el pis (orina) de tono claro o amarillo plido.  Tome baos de agua tibia (baos de asiento) para calmar el dolor o las molestias causadas por las hemorroides. Use una crema para las hemorroides si el mdico la autoriza.  Si tiene venas hinchadas y abultadas (venas varicosas), use medias de soporte. Eleve (levante) los pies durante 15 minutos, 3 o 4 veces por da. Limite el consumo de sal en su dieta.  Evite levantar objetos pesados, usar tacones altos y sintese derecha.  Descanse con las piernas elevadas si tiene calambres o dolor de cintura.  Visite a su dentista  si no lo ha hecho durante el embarazo. Use un cepillo de dientes blando para higienizarse los dientes. Use suavemente el hilo dental.  Puede tener sexo (relaciones sexuales) siempre que el mdico la autorice.  No viaje por largas distancias si puede evitarlo. Slo hgalo con la aprobacin de su mdico.  Haga el curso pre parto.  Practique conducir hasta el hospital.  Prepare el bolso que llevar.  Prepare la habitacin del beb.  Concurra a los controles mdicos. SOLICITE AYUDA SI:   No est segura si est en trabajo de parto o ha roto la bolsa de aguas.  Tiene mareos.  Siente clicos intensos o presin en la zona baja del vientre (abdomen).  Siente un dolor persistente en la zona del vientre.  Tiene malestar estomacal (nuseas), devuelve (vomita), o tiene deposiciones acuosas (diarrea).  Advierte un olor ftido que proviene de la vagina.  Siente dolor al hacer pis (orinar). SOLICITE AYUDA DE INMEDIATO SI:   Tiene fiebre.  Pierde lquido o sangre por la vagina.  Tiene sangrando o pequeas prdidas vaginales.  Siente dolor intenso o clicos en el abdomen.  Sube o baja de peso rpidamente.  Tiene dificultad para respirar o siente dolor en el pecho.  Sbitamente se le hinchan el rostro, las manos, los tobillos, los pies o las piernas.  No ha sentido los movimientos del beb durante una hora.  Siente un dolor de cabeza intenso que no se alivia con medicamentos.  Su visin se modifica. Document   Released: 09/04/2012 °[employer]® Patient Information ©2014 [employer]. ° °

## 2012-12-27 ENCOUNTER — Ambulatory Visit (INDEPENDENT_AMBULATORY_CARE_PROVIDER_SITE_OTHER): Payer: No Typology Code available for payment source | Admitting: Family Medicine

## 2012-12-27 VITALS — BP 105/67 | Temp 97.8°F | Wt 153.0 lb

## 2012-12-27 DIAGNOSIS — Z3480 Encounter for supervision of other normal pregnancy, unspecified trimester: Secondary | ICD-10-CM

## 2012-12-27 DIAGNOSIS — Z3493 Encounter for supervision of normal pregnancy, unspecified, third trimester: Secondary | ICD-10-CM

## 2012-12-27 NOTE — Patient Instructions (Signed)
It was great to see you!!  Tercer trimestre del Psychiatrist  (Third Trimester of Pregnancy) El tercer trimestre del embarazo abarca desde la semana 29 hasta la semana 42, desde el 7 mes hasta el 9. En este trimestre el feto se desarrolla muy rpidamente. Hacia el final del noveno mes, el beb que an no ha nacido mide alrededor de 20 pulgadas (45 cm) de largo y pesa entre 6 y 10 libras (2,700 y 4,500 kg).  CAMBIOS CORPORALES  Su organismo atravesar numerosos cambios durante el [place]. Los cambios varan de una mujer a Educational psychologist.   Seguir [employer]. Es esperable que aumente entre 25 y 35 libras (11 16 kg) hacia el final del [place].  Podrn aparecer las primeras estras en las caderas, abdomen y [place].  Tendr necesidad de Geographical information systems officer con ms frecuencia porque el feto baja hacia la pelvis y presiona en la vejiga.  Como consecuencia del Psychiatrist, podr sentir Actor.  Podr estar constipada ya que ciertas hormonas hacen que los msculos que hacen progresar los desechos a travs de los intestinos trabajen ms lentamente.  Pueden aparecer hemorroides o abultarse e hincharse las venas (venas varicosas).  Podr sentir dolor plvico debido al [employer] de peso ya que las hormonas del embarazo relajan las articulaciones entre los huesos de la pelvis. El dolor de espalda puede ser consecuencia de la exigencia de los msculos que soportan la [place].  Sus mamas seguirn desarrollndose y estarn ms sensibles. A veces sale Veterinary surgeon de las [place], que se llama Product manager.  El ombligo puede salir hacia afuera.  Podr sentir [employer] falta el aire debido a que se expande el tero.  Podr notar que el feto "baja" o que se siente ms bajo en el abdomen.  Podr tener una prdida de secrecin mucosa con sangre. Esto suele ocurrir [employer] unos [address] y Neomia Dear semana antes del [place].  El cuello se vuelve delgado y blando (se borra) cerca de la fecha de [place]. QU DEBE  ESPERAR EN LAS CONSULTAS PRENATALES  Le harn exmenes prenatales cada 2 semanas hasta la semana 36. A partir de ese momento le harn exmenes semanales. Durante una visita prenatal de rutina:   La pesarn para verificar que usted y el feto se encuentran dentro de los lmites normales.  Le tomarn la presin arterial.  Le medirn el abdomen para verificar el desarrollo del beb.  Escucharn los latidos fetales.  Se evaluarn los resultados de los estudios solicitados en visitas anteriores.  Le controlarn el cuello del tero cuando est prxima la fecha de parto para ver si se ha borrado. Alrededor de la semana 36 el mdico controlar el cuello del tero. Al mismo tiempo realizar un anlisis de las secreciones del tejido vaginal. Este examen es para determinar si hay un tipo de bacteria, estreptococo Grupo B. El mdico le explicar esto con ms detalle.  El mdico podr preguntarle:   DIRECTV gustara que fuera el [place].  Cmo se siente.  Si siente los movimientos del beb.  Si tiene sntomas anormales, como prdida de lquido, [place], dolores de cabeza intenso o clicos abdominales.  Si tiene [place] duda. Otros estudios que podrn realizarse durante el tercer trimestre son:   Anlisis de sangre para controlar sus niveles de hierro (anemia).  Controles fetales para determinar su salud, el nivel de [place] y su desarrollo. Si tiene [place] enfermedad o si tuvo problemas durante el [place], le harn estudios. FALSO TRABAJO DE PARTO  Es posible que  sienta contracciones pequeas e irregulares que finalmente desaparecen. Se llaman contracciones de [address] o falso trabajo de [place]. Las contracciones pueden durar horas, [address]  [address] o an Retail buyer antes de que el verdadero trabajo de parto se inicie. Si las contracciones tienen intervalos regulares, se intensifican o se hacen dolorosas, lo mejor es que la revise su mdico.  SIGNOS DE TRABAJO DE PARTO   Espasmos del tipo  menstrual.  Contracciones cada 5 minutos o menos.  Contracciones que comienzan en la parte superior del tero y se expanden hacia abajo, a la zona inferior del abdomen y la espalda.  Sensacin de presin que aumenta en la pelvis o dolor en la espalda.  Aparece una secrecin acuosa o sanguinolenta por la vagina. Si tiene alguno de estos signos antes de la semana 37 del embarazo, llame a su mdico inmediatamente. Debe concurrir al hospital para ser controlada inmediatamente.  INSTRUCCIONES PARA EL CUIDADO EN EL HOGAR   Evite fumar, consumir hierbas, beber alcohol y Chemical engineer frmacos que no le hayan recetado. Estas sustancias qumicas afectan la formacin y el desarrollo del beb.  Siga las indicaciones del profesional con respecto a Adult nurse. Durante el embarazo, hay medicamentos que son seguros y otros no lo son.  Realice actividad fsica slo segn las indicaciones del mdico. Sentir clicos uterinos es el mejor signo para Restaurant manager, fast food actividad fsica.  Contine haciendo comidas regulares y sanas.  Use un sostn que le brinde buen soporte si sus mamas estn sensibles.  No utilice la baera con agua caliente, baos turcos o saunas.  Colquese el cinturn de seguridad cuando conduzca.  Evite comer carne cruda queso sin cocinar y el contacto con los utensilios y desperdicios de los gatos. Estos elementos contienen grmenes que pueden causar defectos de nacimiento en el beb.  Tome las vitaminas indicadas para la etapa prenatal.  Pruebe un laxante (si el mdico la autoriza) si tiene constipacin. Consuma ms alimentos ricos en fibra, como vegetales y frutas frescos y cereales enteros. Beba gran cantidad de lquido para mantener la orina de tono claro o amarillo plido.  Tome baos de agua tibia para Primary school teacher o las molestias causadas por las hemorroides. Use una crema para las hemorroides si el mdico la autoriza.  Si tiene venas varicosas, use medias de soporte.  Eleve los pies durante 15 minutos, 3 4 veces por da. Limite el consumo de sal en su dieta.  Evite levantar objetos pesados, use zapatos de tacones bajos y [place].  Descanse con las piernas elevadas si tiene calambres o dolor de cintura.  Visite a su dentista si no lo ha Occupational hygienist. Use un cepillo de dientes blando para higienizarse los dientes y use suavemente el hilo dental.  Puede continuar su vida sexual excepto que el mdico le indique otra cosa.  No haga viajes largos excepto que sea absolutamente necesario y slo con la aprobacin de su mdico.  Tome clases prenatales para entender, Education administrator y hacer preguntas sobre el [place] de parto y el alumbramiento.  Haga un ensayo sobre la partida al hospital.  Prepare el bolso que llevar al hospital.  Prepare la habitacin del beb.  Contine concurriendo a todas las visitas prenatales segn las indicaciones de su mdico. SOLICITE ATENCIN MDICA SI:   No est segura si est en trabajo de parto o ha roto la bolsa de aguas.  Tiene mareos.  Siente clicos leves, presin en la pelvis o dolor persistente en el abdomen.  Tiene  nuseas o vmitos persistentes.  Brett Fairybserva una secrecin vaginal con mal olor.  Siente dolor al ConocoPhillipsorinar. SOLICITE ATENCIN MDICA DE INMEDIATO SI:   Tiene fiebre.  Pierde lquido o sangre por la vagina.  Tiene sangrado o pequeas prdidas vaginales.  Siente dolor intenso o clicos en el abdomen.  Sube o baja de peso rpidamente.  Le falta el aire y le duele el pecho al respirar.  Sbitamente se le hincha el rostro, las manos, los tobillos, los pies o las piernas de [place] extrema.  No ha sentido los movimientos del beb durante Georgianne Fickuna hora.  Siente un dolor de cabeza intenso que no se alivia con medicamentos.  Su visin se modifica. Document Released: 10/12/2004 Document Revised: 09/04/2012 [facility] Patient Information 2014 [place]Care, [place].

## 2012-12-27 NOTE — Progress Notes (Signed)
[name] is a 32 y.o. G2P1001 at [redacted]w[redacted]d for routine follow up.  She reports some pressure under her ribs, irregular contractions.   See flow sheet for details.  A/P: Pregnancy at [redacted]w[redacted]d.  Doing well.   Pregnancy issues include - none  Infant feeding choice Breast Contraception choice Condoms Infant circumcision desired not applicable GBS/GC/CZ results were reviewed today.    Labor precautions reviewed. Return in 1 week, likely cervical check next week

## 2013-01-02 ENCOUNTER — Ambulatory Visit (INDEPENDENT_AMBULATORY_CARE_PROVIDER_SITE_OTHER): Payer: No Typology Code available for payment source | Admitting: Family Medicine

## 2013-01-02 VITALS — BP 108/71 | Temp 97.8°F | Wt 153.0 lb

## 2013-01-02 DIAGNOSIS — Z3493 Encounter for supervision of normal pregnancy, unspecified, third trimester: Secondary | ICD-10-CM

## 2013-01-02 DIAGNOSIS — Z3480 Encounter for supervision of other normal pregnancy, unspecified trimester: Secondary | ICD-10-CM

## 2013-01-02 NOTE — Progress Notes (Signed)
Betty Bates is a 32 y.o. G2P1001 at [redacted]w[redacted]d for routine follow up.  She reports no problems  See flow sheet for details.  A/P: Pregnancy at [redacted]w[redacted]d.  Doing well.    Infant feeding choice Breast Contraception choice Condoms Infant circumcision desired not applicable GBS/GC/CZ results were reviewed today.   Labor precautions reviewed. Kick counts reviewed. Follow up 1 week, schedule induction for 41 weeks then if no labor.

## 2013-01-02 NOTE — Patient Instructions (Signed)
It was great to see you, come back in 1 week  Tercer trimestre del Psychiatrist  (Third Trimester of Pregnancy) El tercer trimestre del embarazo abarca desde la semana 29 hasta la semana 42, desde el 7 mes hasta el 9. En este trimestre el feto se desarrolla muy rpidamente. Hacia el final del noveno mes, el beb que an no ha nacido mide alrededor de 20 pulgadas (45 cm) de largo y pesa entre 6 y 10 libras (2,700 y 4,500 kg).  CAMBIOS CORPORALES  Su organismo atravesar numerosos cambios durante el [place]. Los cambios varan de una mujer a Educational psychologist.   Seguir [employer]. Es esperable que aumente entre 25 y 35 libras (11 16 kg) hacia el final del [place].  Podrn aparecer las primeras estras en las caderas, abdomen y [place].  Tendr necesidad de Geographical information systems officer con ms frecuencia porque el feto baja hacia la pelvis y presiona en la vejiga.  Como consecuencia del Psychiatrist, podr sentir Actor.  Podr estar constipada ya que ciertas hormonas hacen que los msculos que hacen progresar los desechos a travs de los intestinos trabajen ms lentamente.  Pueden aparecer hemorroides o abultarse e hincharse las venas (venas varicosas).  Podr sentir dolor plvico debido al [employer] de peso ya que las hormonas del embarazo relajan las articulaciones entre los huesos de la pelvis. El dolor de espalda puede ser consecuencia de la exigencia de los msculos que soportan la [place].  Sus mamas seguirn desarrollndose y estarn ms sensibles. A veces sale Veterinary surgeon de las [place], que se llama Product manager.  El ombligo puede salir hacia afuera.  Podr sentir [employer] falta el aire debido a que se expande el tero.  Podr notar que el feto "baja" o que se siente ms bajo en el abdomen.  Podr tener una prdida de secrecin mucosa con sangre. Esto suele ocurrir [employer] unos [address] y Neomia Dear semana antes del [place].  El cuello se vuelve delgado y blando (se borra) cerca de la fecha de  [place]. QU DEBE ESPERAR EN LAS CONSULTAS PRENATALES  Le harn exmenes prenatales cada 2 semanas hasta la semana 36. A partir de ese momento le harn exmenes semanales. Durante una visita prenatal de rutina:   La pesarn para verificar que usted y el feto se encuentran dentro de los lmites normales.  Le tomarn la presin arterial.  Le medirn el abdomen para verificar el desarrollo del beb.  Escucharn los latidos fetales.  Se evaluarn los resultados de los estudios solicitados en visitas anteriores.  Le controlarn el cuello del tero cuando est prxima la fecha de parto para ver si se ha borrado. Alrededor de la semana 36 el mdico controlar el cuello del tero. Al mismo tiempo realizar un anlisis de las secreciones del tejido vaginal. Este examen es para determinar si hay un tipo de bacteria, estreptococo Grupo B. El mdico le explicar esto con ms detalle.  El mdico podr preguntarle:   DIRECTV gustara que fuera el [place].  Cmo se siente.  Si siente los movimientos del beb.  Si tiene sntomas anormales, como prdida de lquido, [place], dolores de cabeza intenso o clicos abdominales.  Si tiene [place] duda. Otros estudios que podrn realizarse durante el tercer trimestre son:   Anlisis de sangre para controlar sus niveles de hierro (anemia).  Controles fetales para determinar su salud, el nivel de [place] y su desarrollo. Si tiene [place] enfermedad o si tuvo problemas durante el [place], le harn estudios. FALSO TRABAJO DE  PARTO  Es posible que sienta contracciones pequeas e irregulares que finalmente desaparecen. Se llaman contracciones de [address] o falso trabajo de [place]. Las contracciones pueden durar horas, [address]  [address] o an Retail buyer antes de que el verdadero trabajo de parto se inicie. Si las contracciones tienen intervalos regulares, se intensifican o se hacen dolorosas, lo mejor es que la revise su mdico.  SIGNOS DE TRABAJO DE PARTO   Espasmos del tipo  menstrual.  Contracciones cada 5 minutos o menos.  Contracciones que comienzan en la parte superior del tero y se expanden hacia abajo, a la zona inferior del abdomen y la espalda.  Sensacin de presin que aumenta en la pelvis o dolor en la espalda.  Aparece una secrecin acuosa o sanguinolenta por la vagina. Si tiene alguno de estos signos antes de la semana 37 del embarazo, llame a su mdico inmediatamente. Debe concurrir al hospital para ser controlada inmediatamente.  INSTRUCCIONES PARA EL CUIDADO EN EL HOGAR   Evite fumar, consumir hierbas, beber alcohol y Chemical engineer frmacos que no le hayan recetado. Estas sustancias qumicas afectan la formacin y el desarrollo del beb.  Siga las indicaciones del profesional con respecto a Adult nurse. Durante el embarazo, hay medicamentos que son seguros y otros no lo son.  Realice actividad fsica slo segn las indicaciones del mdico. Sentir clicos uterinos es el mejor signo para Restaurant manager, fast food actividad fsica.  Contine haciendo comidas regulares y sanas.  Use un sostn que le brinde buen soporte si sus mamas estn sensibles.  No utilice la baera con agua caliente, baos turcos o saunas.  Colquese el cinturn de seguridad cuando conduzca.  Evite comer carne cruda queso sin cocinar y el contacto con los utensilios y desperdicios de los gatos. Estos elementos contienen grmenes que pueden causar defectos de nacimiento en el beb.  Tome las vitaminas indicadas para la etapa prenatal.  Pruebe un laxante (si el mdico la autoriza) si tiene constipacin. Consuma ms alimentos ricos en fibra, como vegetales y frutas frescos y cereales enteros. Beba gran cantidad de lquido para mantener la orina de tono claro o amarillo plido.  Tome baos de agua tibia para Primary school teacher o las molestias causadas por las hemorroides. Use una crema para las hemorroides si el mdico la autoriza.  Si tiene venas varicosas, use medias de soporte.  Eleve los pies durante 15 minutos, 3 4 veces por da. Limite el consumo de sal en su dieta.  Evite levantar objetos pesados, use zapatos de tacones bajos y [place].  Descanse con las piernas elevadas si tiene calambres o dolor de cintura.  Visite a su dentista si no lo ha Occupational hygienist. Use un cepillo de dientes blando para higienizarse los dientes y use suavemente el hilo dental.  Puede continuar su vida sexual excepto que el mdico le indique otra cosa.  No haga viajes largos excepto que sea absolutamente necesario y slo con la aprobacin de su mdico.  Tome clases prenatales para entender, Education administrator y hacer preguntas sobre el [place] de parto y el alumbramiento.  Haga un ensayo sobre la partida al hospital.  Prepare el bolso que llevar al hospital.  Prepare la habitacin del beb.  Contine concurriendo a todas las visitas prenatales segn las indicaciones de su mdico. SOLICITE ATENCIN MDICA SI:   No est segura si est en trabajo de parto o ha roto la bolsa de aguas.  Tiene mareos.  Siente clicos leves, presin en la pelvis o dolor persistente  en el abdomen.  Tiene nuseas o vmitos persistentes.  Brett Fairy secrecin vaginal con mal olor.  Siente dolor al [employer]. SOLICITE ATENCIN MDICA DE INMEDIATO SI:   Tiene fiebre.  Pierde lquido o sangre por la vagina.  Tiene sangrado o pequeas prdidas vaginales.  Siente dolor intenso o clicos en el abdomen.  Sube o baja de peso rpidamente.  Le falta el aire y le duele el pecho al respirar.  Sbitamente se le hincha el rostro, las manos, los tobillos, los pies o las piernas de [place].  No ha sentido los movimientos del beb durante Georgianne Fick.  Siente un dolor de cabeza intenso que no se alivia con medicamentos.  Su visin se modifica. Document Released: 10/12/2004 Document Revised: 09/04/2012 [facility] Patient Information 2014 [place], [place].

## 2013-01-08 ENCOUNTER — Ambulatory Visit (INDEPENDENT_AMBULATORY_CARE_PROVIDER_SITE_OTHER): Payer: No Typology Code available for payment source | Admitting: Family Medicine

## 2013-01-08 VITALS — BP 104/69 | Temp 98.5°F | Wt 152.8 lb

## 2013-01-08 DIAGNOSIS — Z3480 Encounter for supervision of other normal pregnancy, unspecified trimester: Secondary | ICD-10-CM

## 2013-01-08 DIAGNOSIS — O480 Post-term pregnancy: Secondary | ICD-10-CM

## 2013-01-08 DIAGNOSIS — Z3493 Encounter for supervision of normal pregnancy, unspecified, third trimester: Secondary | ICD-10-CM

## 2013-01-08 NOTE — Assessment & Plan Note (Signed)
Twice weakly monitoring being arranged ASAP given clinics are closed for the christmas holidays.  Induction scheduled next week, Friday 01/17/2013 at 41.1 weeks.

## 2013-01-08 NOTE — Progress Notes (Signed)
Kmya Placide is a 32 y.o. G2P1001  at [redacted]w[redacted]d for routine follow up.  She reports irregular weak contractions.   See flow sheet for details.  A/P: Pregnancy at [redacted]w[redacted]d.  Doing well.   Pregnancy issues include none  Infant feeding choice Breast Contraception choice condoms Infant circumcision desired not applicable GBS/GC/CZ results  were reviewed today.   Labor precautions reviewed. Kick counts reviewed. Twice weekly testing unable to be scheduled due to closed clinic, Will schedule on Monday.  Induction scheduled for 41+ weeks on Friday 01/17/2013

## 2013-01-08 NOTE — Patient Instructions (Signed)
You will be scheduled a Biophysical profile and an NST for early next week. Your induction will be scheduled as well.   If you have the signs of labor we discussed go straight to [facility] hospital.   Tercer trimestre del Psychiatrist  (Third Trimester of Pregnancy) El tercer trimestre del embarazo abarca desde la semana 29 hasta la semana 42, desde el 7 mes hasta el 9. En este trimestre el feto se desarrolla muy rpidamente. Hacia el final del noveno mes, el beb que an no ha nacido mide alrededor de 20 pulgadas (45 cm) de largo y pesa entre 6 y 10 libras (2,700 y 4,500 kg).  CAMBIOS CORPORALES  Su organismo atravesar numerosos cambios durante el [place]. Los cambios varan de una mujer a Educational psychologist.   Seguir [employer]. Es esperable que aumente entre 25 y 35 libras (11 16 kg) hacia el final del [place].  Podrn aparecer las primeras estras en las caderas, abdomen y [place].  Tendr necesidad de Geographical information systems officer con ms frecuencia porque el feto baja hacia la pelvis y presiona en la vejiga.  Como consecuencia del Psychiatrist, podr sentir Actor.  Podr estar constipada ya que ciertas hormonas hacen que los msculos que hacen progresar los desechos a travs de los intestinos trabajen ms lentamente.  Pueden aparecer hemorroides o abultarse e hincharse las venas (venas varicosas).  Podr sentir dolor plvico debido al [employer] de peso ya que las hormonas del embarazo relajan las articulaciones entre los huesos de la pelvis. El dolor de espalda puede ser consecuencia de la exigencia de los msculos que soportan la [place].  Sus mamas seguirn desarrollndose y estarn ms sensibles. A veces sale Veterinary surgeon de las [place], que se llama Product manager.  El ombligo puede salir hacia afuera.  Podr sentir [employer] falta el aire debido a que se expande el tero.  Podr notar que el feto "baja" o que se siente ms bajo en el abdomen.  Podr tener una prdida de secrecin mucosa  con sangre. Esto suele ocurrir [employer] unos [address] y Neomia Dear semana antes del [place].  El cuello se vuelve delgado y blando (se borra) cerca de la fecha de [place]. QU DEBE ESPERAR EN LAS CONSULTAS PRENATALES  Le harn exmenes prenatales cada 2 semanas hasta la semana 36. A partir de ese momento le harn exmenes semanales. Durante una visita prenatal de rutina:   La pesarn para verificar que usted y el feto se encuentran dentro de los lmites normales.  Le tomarn la presin arterial.  Le medirn el abdomen para verificar el desarrollo del beb.  Escucharn los latidos fetales.  Se evaluarn los resultados de los estudios solicitados en visitas anteriores.  Le controlarn el cuello del tero cuando est prxima la fecha de parto para ver si se ha borrado. Alrededor de la semana 36 el mdico controlar el cuello del tero. Al mismo tiempo realizar un anlisis de las secreciones del tejido vaginal. Este examen es para determinar si hay un tipo de bacteria, estreptococo Grupo B. El mdico le explicar esto con ms detalle.  El mdico podr preguntarle:   DIRECTV gustara que fuera el [place].  Cmo se siente.  Si siente los movimientos del beb.  Si tiene sntomas anormales, como prdida de lquido, [place], dolores de cabeza intenso o clicos abdominales.  Si tiene [place] duda. Otros estudios que podrn realizarse durante el tercer trimestre son:   Anlisis de sangre para controlar sus niveles de hierro (anemia).  Controles fetales para  determinar su salud, el nivel de [place] y su desarrollo. Si tiene [place] enfermedad o si tuvo problemas durante el [place], le harn estudios. FALSO TRABAJO DE PARTO  Es posible que sienta contracciones pequeas e irregulares que finalmente desaparecen. Se llaman contracciones de [address] o falso trabajo de [place]. Las contracciones pueden durar horas, [address] o an Retail buyer antes de que el verdadero trabajo de parto se inicie. Si las contracciones  tienen intervalos regulares, se intensifican o se hacen dolorosas, lo mejor es que la revise su mdico.  SIGNOS DE TRABAJO DE PARTO   Espasmos del tipo menstrual.  Contracciones cada 5 minutos o menos.  Contracciones que comienzan en la parte superior del tero y se expanden hacia abajo, a la zona inferior del abdomen y la espalda.  Sensacin de presin que aumenta en la pelvis o dolor en la espalda.  Aparece una secrecin acuosa o sanguinolenta por la vagina. Si tiene alguno de estos signos antes de la semana 37 del embarazo, llame a su mdico inmediatamente. Debe concurrir al hospital para ser controlada inmediatamente.  INSTRUCCIONES PARA EL CUIDADO EN EL HOGAR   Evite fumar, consumir hierbas, beber alcohol y Chemical engineer frmacos que no le hayan recetado. Estas sustancias qumicas afectan la formacin y el desarrollo del beb.  Siga las indicaciones del profesional con respecto a Adult nurse. Durante el embarazo, hay medicamentos que son seguros y otros no lo son.  Realice actividad fsica slo segn las indicaciones del mdico. Sentir clicos uterinos es el mejor signo para Restaurant manager, fast food actividad fsica.  Contine haciendo comidas regulares y sanas.  Use un sostn que le brinde buen soporte si sus mamas estn sensibles.  No utilice la baera con agua caliente, baos turcos o saunas.  Colquese el cinturn de seguridad cuando conduzca.  Evite comer carne cruda queso sin cocinar y el contacto con los utensilios y desperdicios de los gatos. Estos elementos contienen grmenes que pueden causar defectos de nacimiento en el beb.  Tome las vitaminas indicadas para la etapa prenatal.  Pruebe un laxante (si el mdico la autoriza) si tiene constipacin. Consuma ms alimentos ricos en fibra, como vegetales y frutas frescos y cereales enteros. Beba gran cantidad de lquido para mantener la orina de tono claro o amarillo plido.  Tome baos de agua tibia para Primary school teacher o  las molestias causadas por las hemorroides. Use una crema para las hemorroides si el mdico la autoriza.  Si tiene venas varicosas, use medias de soporte. Eleve los pies durante 15 minutos, 3 4 veces por da. Limite el consumo de sal en su dieta.  Evite levantar objetos pesados, use zapatos de tacones bajos y [place].  Descanse con las piernas elevadas si tiene calambres o dolor de cintura.  Visite a su dentista si no lo ha Occupational hygienist. Use un cepillo de dientes blando para higienizarse los dientes y use suavemente el hilo dental.  Puede continuar su vida sexual excepto que el mdico le indique otra cosa.  No haga viajes largos excepto que sea absolutamente necesario y slo con la aprobacin de su mdico.  Tome clases prenatales para entender, Education administrator y hacer preguntas sobre el [place] de parto y el alumbramiento.  Haga un ensayo sobre la partida al hospital.  Prepare el bolso que llevar al hospital.  Prepare la habitacin del beb.  Contine concurriendo a todas las visitas prenatales segn las indicaciones de su mdico. SOLICITE ATENCIN MDICA SI:   No est segura  si est en trabajo de parto o ha roto la bolsa de aguas.  Tiene mareos.  Siente clicos leves, presin en la pelvis o dolor persistente en el abdomen.  Tiene nuseas o vmitos persistentes.  Brett Fairy secrecin vaginal con mal olor.  Siente dolor al [employer]. SOLICITE ATENCIN MDICA DE INMEDIATO SI:   Tiene fiebre.  Pierde lquido o sangre por la vagina.  Tiene sangrado o pequeas prdidas vaginales.  Siente dolor intenso o clicos en el abdomen.  Sube o baja de peso rpidamente.  Le falta el aire y le duele el pecho al respirar.  Sbitamente se le hincha el rostro, las manos, los tobillos, los pies o las piernas de [place].  No ha sentido los movimientos del beb durante Georgianne Fick.  Siente un dolor de cabeza intenso que no se alivia con medicamentos.  Su  visin se modifica. Document Released: 10/12/2004 Document Revised: 09/04/2012 [facility] Patient Information 2014 [place], [place].

## 2013-01-10 ENCOUNTER — Encounter (HOSPITAL_COMMUNITY): Payer: Self-pay | Admitting: *Deleted

## 2013-01-10 ENCOUNTER — Telehealth (HOSPITAL_COMMUNITY): Payer: Self-pay | Admitting: *Deleted

## 2013-01-10 NOTE — Telephone Encounter (Signed)
Preadmission screen  

## 2013-01-16 NOTE — L&D Delivery Note (Signed)
Attestation of Attending Supervision of Advanced Practitioner (CNM/NP): Evaluation and management procedures were performed by the Advanced Practitioner under my supervision and collaboration.  I have reviewed the Advanced Practitioner's note and chart, and I agree with the management and plan.  Mattelyn Imhoff 01/17/2013 2:12 PM

## 2013-01-16 NOTE — L&D Delivery Note (Signed)
Delivery Note At 1:00 PM a viable female was delivered via Vaginal, Spontaneous Delivery (Presentation: Left Occiput Anterior).  APGAR: 9, 9; weight .   Placenta status: Intact, Spontaneous.  Cord: 3 vessels with the following complications: None.   Anesthesia: Local  Episiotomy: None Lacerations: 2nd degree;Perineal Suture Repair: 3.0 vicryl Est. Blood Loss (mL): 350  Mom to postpartum.  Baby to Couplet care / Skin to Skin.  NSVD w/ 2nd degree tear. Active mangemetn of 3rd stage with pit and traction delivered intact placenta w/ 3v cord. 2nd degree repair with 3.0 vicryl on CT in usual fashion. EBL 350 hemostatic and counts correct.   Damon Hargrove RYAN 01/17/2013, 1:20 PM

## 2013-01-17 ENCOUNTER — Encounter (HOSPITAL_COMMUNITY): Payer: Self-pay

## 2013-01-17 ENCOUNTER — Inpatient Hospital Stay (HOSPITAL_COMMUNITY)
Admission: RE | Admit: 2013-01-17 | Discharge: 2013-01-18 | DRG: 775 | Disposition: A | Discharge status: Home / Self Care | Payer: Medicaid Other | Source: Ambulatory Visit | Attending: Obstetrics & Gynecology | Admitting: Obstetrics & Gynecology

## 2013-01-17 DIAGNOSIS — O480 Post-term pregnancy: Principal | ICD-10-CM | POA: Diagnosis present

## 2013-01-17 LAB — TYPE AND SCREEN
ABO/RH(D): A POS
Antibody Screen: NEGATIVE

## 2013-01-17 LAB — CBC
HEMATOCRIT: 39.9 % (ref 36.0–46.0)
Hemoglobin: 13.8 g/dL (ref 12.0–15.0)
MCH: 29.3 pg (ref 26.0–34.0)
MCHC: 34.6 g/dL (ref 30.0–36.0)
MCV: 84.7 fL (ref 78.0–100.0)
Platelets: ADEQUATE 10*3/uL (ref 150–400)
RBC: 4.71 MIL/uL (ref 3.87–5.11)
RDW: 17.8 % — ABNORMAL HIGH (ref 11.5–15.5)
WBC: 8.1 10*3/uL (ref 4.0–10.5)

## 2013-01-17 LAB — ABO/RH: ABO/RH(D): A POS

## 2013-01-17 LAB — RPR: RPR: NONREACTIVE

## 2013-01-17 MED ORDER — OXYTOCIN BOLUS FROM INFUSION: 500.0000 mL | INTRAVENOUS | Status: DC

## 2013-01-17 MED ORDER — PRENATAL MULTIVITAMIN CH
1.0000 | ORAL_TABLET | Freq: Every day | ORAL | Status: DC
  Administered 2013-01-18: 1 via ORAL
  Filled 2013-01-17: qty 1

## 2013-01-17 MED ORDER — OXYCODONE-ACETAMINOPHEN 5-325 MG PO TABS: 1.0000 | ORAL_TABLET | ORAL | Status: DC | PRN

## 2013-01-17 MED ORDER — DIBUCAINE 1 % RE OINT
1.0000 | TOPICAL_OINTMENT | RECTAL | Status: DC | PRN
Start: 2013-01-17 — End: 2013-01-18

## 2013-01-17 MED ORDER — ZOLPIDEM TARTRATE 5 MG PO TABS: 5.0000 mg | ORAL_TABLET | Freq: Every evening | ORAL | Status: DC | PRN

## 2013-01-17 MED ORDER — ACETAMINOPHEN 325 MG PO TABS: 650.0000 mg | ORAL_TABLET | ORAL | Status: DC | PRN

## 2013-01-17 MED ORDER — DIPHENHYDRAMINE HCL 50 MG/ML IJ SOLN: 12.5000 mg | INTRAMUSCULAR | Status: DC | PRN

## 2013-01-17 MED ORDER — BENZOCAINE-MENTHOL 20-0.5 % EX AERO
1.0000 "application " | INHALATION_SPRAY | CUTANEOUS | Status: DC | PRN
  Administered 2013-01-17: 1 via TOPICAL
  Filled 2013-01-17: qty 56

## 2013-01-17 MED ORDER — DIPHENHYDRAMINE HCL 25 MG PO CAPS
25.0000 mg | ORAL_CAPSULE | Freq: Four times a day (QID) | ORAL | Status: DC | PRN
Start: 2013-01-17 — End: 2013-01-18

## 2013-01-17 MED ORDER — LANOLIN HYDROUS EX OINT: TOPICAL_OINTMENT | CUTANEOUS | Status: DC | PRN

## 2013-01-17 MED ORDER — ONDANSETRON HCL 4 MG/2ML IJ SOLN: 4.0000 mg | INTRAMUSCULAR | Status: DC | PRN

## 2013-01-17 MED ORDER — ONDANSETRON HCL 4 MG PO TABS: 4.0000 mg | ORAL_TABLET | ORAL | Status: DC | PRN

## 2013-01-17 MED ORDER — IBUPROFEN 600 MG PO TABS
600.0000 mg | ORAL_TABLET | Freq: Four times a day (QID) | ORAL | Status: DC
  Administered 2013-01-18 (×3): 600 mg via ORAL
  Filled 2013-01-17 (×3): qty 1

## 2013-01-17 MED ORDER — SIMETHICONE 80 MG PO CHEW: 80.0000 mg | CHEWABLE_TABLET | ORAL | Status: DC | PRN

## 2013-01-17 MED ORDER — EPHEDRINE 5 MG/ML INJ
10.0000 mg | INTRAVENOUS | Status: DC | PRN
  Filled 2013-01-17: qty 2

## 2013-01-17 MED ORDER — ONDANSETRON HCL 4 MG/2ML IJ SOLN: 4.0000 mg | Freq: Four times a day (QID) | INTRAMUSCULAR | Status: DC | PRN

## 2013-01-17 MED ORDER — LIDOCAINE HCL (PF) 1 % IJ SOLN
30.0000 mL | INTRAMUSCULAR | Status: DC | PRN
  Administered 2013-01-17: 30 mL via SUBCUTANEOUS
  Filled 2013-01-17 (×2): qty 30

## 2013-01-17 MED ORDER — LACTATED RINGERS IV SOLN: 500.0000 mL | INTRAVENOUS | Status: DC | PRN

## 2013-01-17 MED ORDER — PHENYLEPHRINE 40 MCG/ML (10ML) SYRINGE FOR IV PUSH (FOR BLOOD PRESSURE SUPPORT)
80.0000 ug | PREFILLED_SYRINGE | INTRAVENOUS | Status: DC | PRN
  Filled 2013-01-17: qty 2

## 2013-01-17 MED ORDER — FENTANYL 2.5 MCG/ML BUPIVACAINE 1/10 % EPIDURAL INFUSION (WH - ANES): 14.0000 mL/h | INTRAMUSCULAR | Status: DC | PRN

## 2013-01-17 MED ORDER — LACTATED RINGERS IV SOLN: 500.0000 mL | Freq: Once | INTRAVENOUS | Status: DC

## 2013-01-17 MED ORDER — OXYTOCIN 40 UNITS IN LACTATED RINGERS INFUSION - SIMPLE MED
1.0000 m[IU]/min | INTRAVENOUS | Status: DC
  Administered 2013-01-17: 2 m[IU]/min via INTRAVENOUS
  Filled 2013-01-17: qty 1000

## 2013-01-17 MED ORDER — SENNOSIDES-DOCUSATE SODIUM 8.6-50 MG PO TABS
2.0000 | ORAL_TABLET | ORAL | Status: DC
  Administered 2013-01-18: 2 via ORAL
  Filled 2013-01-17: qty 2

## 2013-01-17 MED ORDER — OXYTOCIN 40 UNITS IN LACTATED RINGERS INFUSION - SIMPLE MED
62.5000 mL/h | INTRAVENOUS | Status: DC
Start: 2013-01-17 — End: 2013-01-17

## 2013-01-17 MED ORDER — WITCH HAZEL-GLYCERIN EX PADS: 1.0000 "application " | MEDICATED_PAD | CUTANEOUS | Status: DC | PRN

## 2013-01-17 MED ORDER — TERBUTALINE SULFATE 1 MG/ML IJ SOLN: 0.2500 mg | Freq: Once | INTRAMUSCULAR | Status: DC | PRN

## 2013-01-17 MED ORDER — LACTATED RINGERS IV SOLN
INTRAVENOUS | Status: DC
  Administered 2013-01-17: 08:00:00 via INTRAVENOUS

## 2013-01-17 MED ORDER — IBUPROFEN 600 MG PO TABS
600.0000 mg | ORAL_TABLET | Freq: Four times a day (QID) | ORAL | Status: DC | PRN
  Administered 2013-01-17: 600 mg via ORAL
  Filled 2013-01-17: qty 1

## 2013-01-17 MED ORDER — CITRIC ACID-SODIUM CITRATE 334-500 MG/5ML PO SOLN: 30.0000 mL | ORAL | Status: DC | PRN

## 2013-01-17 MED ORDER — TETANUS-DIPHTH-ACELL PERTUSSIS 5-2.5-18.5 LF-MCG/0.5 IM SUSP: 0.5000 mL | Freq: Once | INTRAMUSCULAR | Status: DC

## 2013-01-17 NOTE — H&P (Signed)
Betty Bates is a 33 y.o. female presenting for Induction of Labor for Post Dates. Maternal Medical History:  Reason for admission: Nausea.  Contractions: Frequency: rare.   Perceived severity is mild.    Fetal activity: Perceived fetal activity is normal.   Last perceived fetal movement was within the past hour.    Prenatal complications: No bleeding, pre-eclampsia or preterm labor.   Prenatal Complications - Diabetes: none.    OB History   Grav Para Term Preterm Abortions TAB SAB Ect Mult Living   2 1 1       1      Past Medical History  Diagnosis Date  . Medical history non-contributory    Past Surgical History  Procedure Laterality Date  . No past surgeries     Family History: family history includes Cancer in her maternal aunt; Cancer (age of onset: 6545) in her mother; Diabetes in her paternal uncle. Social History:  reports that she has never smoked. She has never used smokeless tobacco. She reports that she does not drink alcohol or use illicit drugs.  Review of Systems  Constitutional: Negative for fever, chills and malaise/fatigue.  Gastrointestinal: Negative for nausea, vomiting, abdominal pain, diarrhea and constipation.  Genitourinary: Negative for dysuria.  Neurological: Negative for headaches.    Dilation: 2 Effacement (%): 90 Station: -2 Exam by:: See Beharry CNM Blood pressure 102/67, pulse 85, temperature 97.6 F (36.4 C), temperature source Oral, resp. rate 18, height 5\' 3"  (1.6 m), weight 69.4 kg (153 lb), last menstrual period 04/04/2012. Maternal Exam:  Uterine Assessment: Contraction strength is mild.  Contraction frequency is rare.   Abdomen: Fundal height is 39.   Estimated fetal weight is 7.5.   Fetal presentation: vertex  Introitus: Normal vulva. Normal vagina.  Vagina is negative for discharge.  Ferning test: not done.  Nitrazine test: not done. Amniotic fluid character: not assessed.  Pelvis: adequate for delivery.   Cervix:  Cervix evaluated by digital exam.     Fetal Exam Fetal Monitor Review: Mode: ultrasound.   Baseline rate: 135.  Variability: moderate (6-25 bpm).   Pattern: accelerations present and no decelerations.    Fetal State Assessment: Category I - tracings are normal.     Physical Exam  Constitutional: She is oriented to person, place, and time. She appears well-developed and well-nourished. No distress.  HENT:  Head: Normocephalic.  Cardiovascular: Normal rate.   Respiratory: Effort normal.  GI: Soft. She exhibits no distension. There is no tenderness. There is no rebound and no guarding.  Genitourinary: Vagina normal and uterus normal. No vaginal discharge found.  Dilation: 2 Effacement (%): 90 Station: -2 Presentation: Vertex Exam by:: Tatum Corl CNM   Musculoskeletal: Normal range of motion.  Neurological: She is alert and oriented to person, place, and time.  Skin: Skin is warm and dry.  Psychiatric: She has a normal mood and affect.    Prenatal labs: ABO, Rh: A/POS/-- (06/26 0955) Antibody: NEG (06/26 0955) Rubella: 23.00 (06/26 0955) RPR: NON REAC (10/08 1129)  HBsAg: NEGATIVE (06/26 0955)  HIV: NON REACTIVE (10/08 1129)  GBS: NEGATIVE (11/25 1550)   Assessment/Plan: A:   SIUP at 10459w1d        PostDates       Favorable cervix       [facility]      Routine orders      Induction of labor via Pitocin    [facility] ComplexWILLIAMS,Asencion Guisinger 01/17/2013, 8:58 AM

## 2013-01-17 NOTE — Lactation Note (Signed)
This note was copied from the chart of Betty Bates. Lactation Consultation Note  Patient Name: Betty Bates NGEXB'MToday's Date: 01/17/2013 Reason for consult: Initial assessment of this second-time mom and her newborn at 119 hours of age.  Mom states she pumped for 3 months with her 33 yo but states this newborn is latching well and she is able to express colostrum as needed.  Baby's initial LATCH score=9 and baby nursed for 40 minutes, per initial feeding record.  LC encouraged cue feedings and STS. LC encouraged review of Baby and Me pp 9, 14 and 20-25 for STS and BF information. LC provided [employer] Resource brochure and reviewed [facility] and list of community and web site resources.     Maternal Data Formula Feeding for Exclusion: No Infant to breast within first hour of birth: Yes (initial LATCh score=9 and breastfed for 40 minutes) Has patient been taught Hand Expression?: Yes (mom states she knows how to express her milk by hand) Does the patient have breastfeeding experience prior to this delivery?: Yes  Feeding    LATCH Score/Interventions         Initial LATCH score-9             Lactation Tools Discussed/Used   STS, cue feeding, hand expression  Consult Status Consult Status: Follow-up Date: 01/18/13 Follow-up type: In-patient    Warrick ParisianBryant, Bria [facility] 01/17/2013, 11:07 PM

## 2013-01-18 MED ORDER — IBUPROFEN 600 MG PO TABS: 600.0000 mg | ORAL_TABLET | Freq: Four times a day (QID) | ORAL | Status: DC

## 2013-01-18 NOTE — Discharge Instructions (Signed)

## 2013-01-18 NOTE — Discharge Summary (Signed)
Obstetric Discharge Summary Reason for Admission: induction of labor Prenatal Procedures: none Intrapartum Procedures: spontaneous vaginal delivery Postpartum Procedures: none Complications-Operative and Postpartum: 2nd degree perineal laceration Hemoglobin  Date Value Range Status  01/17/2013 13.8  12.0 - 15.0 g/dL Final     HCT  Date Value Range Status  01/17/2013 39.9  36.0 - 46.0 % Final   Betty Bates is a 32yo G2P1001 admitted at 41.1wks for IOL due to postdates in the AM of 01/17/13. Her cx was favorable and so Pitocin was started. She progressed to active labor and delivered shortly after noon the same day. On PPD#1 she is doing well and is deemed to have received the full benefit of her hospital stay and will be discharged home this afternoon. She will follow up PP with Dr Clinton SawyerWilliamson in 4-6 weeks. She is breastfeeding and declines contraception.  Physical Exam:  General: alert, cooperative and no distress Lungs: nl effort Heart: RRR Lochia: appropriate Uterine Fundus: firm DVT Evaluation: No evidence of DVT seen on physical exam.  Discharge Diagnoses: Term Pregnancy-delivered  Discharge Information: Date: 01/18/2013 Activity: pelvic rest Diet: routine Medications: PNV and Ibuprofen Condition: stable Instructions: refer to practice specific booklet Discharge to: home Follow-up Information   Follow up with Mat CarneWILLIAMSON, EDWARD, MD. Schedule an appointment as soon as possible for a visit in 4 weeks.   Specialty:  Family Medicine   Contact information:   [address] [id number] [phone]       Newborn Data: Live born female  Birth Weight: 7 lb 12 oz (3515 g) APGAR: 9, 9  Home with mother.  Betty HaiSHAW, Ziva Bates 01/18/2013, 8:46 AM

## 2013-01-20 NOTE — H&P (Signed)
Attestation of Attending Supervision of Advanced Practitioner (PA/CNM/NP): Evaluation and management procedures were performed by the Advanced Practitioner under my supervision and collaboration.  I have reviewed the Advanced Practitioner's note and chart, and I agree with the management and plan.  Caitlen Worth, MD, FACOG Attending Obstetrician & Gynecologist Faculty Practice, [facility]  

## 2013-01-20 NOTE — Discharge Summary (Signed)
Attestation of Attending Supervision of Advanced Practitioner (CNM/NP): Evaluation and management procedures were performed by the Advanced Practitioner under my supervision and collaboration.  I have reviewed the Advanced Practitioner's note and chart, and I agree with the management and plan.  Glenis Musolf 01/20/2013 7:38 AM

## 2013-01-21 NOTE — Progress Notes (Signed)
Post discharge chart review completed.  

## 2013-01-22 ENCOUNTER — Ambulatory Visit: Payer: No Typology Code available for payment source

## 2013-03-19 ENCOUNTER — Encounter: Payer: Self-pay | Admitting: Family Medicine

## 2013-03-19 ENCOUNTER — Ambulatory Visit (INDEPENDENT_AMBULATORY_CARE_PROVIDER_SITE_OTHER): Payer: No Typology Code available for payment source | Admitting: Family Medicine

## 2013-03-19 VITALS — BP 124/82 | HR 103 | Temp 98.2°F | Wt 137.0 lb

## 2013-03-19 DIAGNOSIS — Z3490 Encounter for supervision of normal pregnancy, unspecified, unspecified trimester: Secondary | ICD-10-CM

## 2013-03-19 DIAGNOSIS — Z3480 Encounter for supervision of other normal pregnancy, unspecified trimester: Secondary | ICD-10-CM

## 2013-03-19 DIAGNOSIS — K089 Disorder of teeth and supporting structures, unspecified: Secondary | ICD-10-CM

## 2013-03-19 NOTE — Assessment & Plan Note (Signed)
2 mos post partum without evidence of serious issues like PP cardiomyopathy or Sheehan's syndrome; Does have mild depression per PHQ-9, but pt explains that she has little interest in doing things b/c it's been so cold and that she's tired from caring for her children, which make sense; Pt counseled on normal ovulation after pregnancy and lack of need for screening of cervical cancer based upon previous result

## 2013-03-19 NOTE — Progress Notes (Signed)
   Subjective:    Patient ID: Betty GlassmanLeslie Quijada-Compean, female    DOB: 1980-07-29, 33 y.o.   MRN: [id number]  HPI  33 year old F2P2002 who presents for post partum f/u after SVD at 41.[redacted] weeks ega. She delivered a healthy female on 01/17/13 after IOL for post-dates. The patient did have a 2nd degree perineal laceration repaired at the time of delivery. The child's name is Devin.   Today the patient reports no concerns. She has not had a period. She is sexually active and using condoms for contraception. She does not want any other form of birth control. She is breastfeeding her daughter. She denies any concerns about her mood today. She does ask specifically about when she should have her next cervical cancer screening and is concerned that she is at higher risk b/c her mother has cervical cancer.   Review of Systems Neg for chest pain, SOB, swelling of extremities, syncope     Objective:   Physical Exam BP 124/82  Pulse 103  Temp(Src) 98.2 F (36.8 C) (Oral)  Wt 137 lb (62.143 kg)  Breastfeeding? Yes   Gen: middle age Hispanic female, well appearing, NAD, pleasant and conversant HEENT: NCAT, PERRLA, EOMI, OP clear and moist, no oropharyngeal exudate CV: RRR, no m/r/g, no JVD or carotid bruits Pulm: normal WOB, CTA-B Extremities: no edema or joint tenderness GU: > External: no lesions > Vagina: no blood in vault, laceration well healed  > Uterus: small, mobile  Psych: normal affect, speech and thought content       Assessment & Plan:

## 2013-03-19 NOTE — Patient Instructions (Signed)
Ms. Betty Bates,   Great to see you. Everything looks good. Please continue breast feeding. You can have a pap smear in July if you want. However, I do not have any evidence that you need any extra screening at this time.   Take Care,   Dr. Clinton SawyerWilliamson

## 2013-04-25 ENCOUNTER — Ambulatory Visit (INDEPENDENT_AMBULATORY_CARE_PROVIDER_SITE_OTHER): Payer: No Typology Code available for payment source | Admitting: Family Medicine

## 2013-04-25 ENCOUNTER — Encounter: Payer: Self-pay | Admitting: Family Medicine

## 2013-04-25 VITALS — BP 116/77 | HR 69 | Temp 98.7°F | Ht 63.0 in | Wt 138.8 lb

## 2013-04-25 DIAGNOSIS — R21 Rash and other nonspecific skin eruption: Secondary | ICD-10-CM | POA: Insufficient documentation

## 2013-04-25 DIAGNOSIS — L22 Diaper dermatitis: Secondary | ICD-10-CM

## 2013-04-25 DIAGNOSIS — Z3009 Encounter for other general counseling and advice on contraception: Secondary | ICD-10-CM

## 2013-04-25 MED ORDER — ACYCLOVIR 400 MG PO TABS: 400.0000 mg | ORAL_TABLET | Freq: Three times a day (TID) | ORAL | Status: DC

## 2013-04-25 NOTE — Patient Instructions (Addendum)
Betty Bates,   I think that the rash that you have is herpes. You should take the acyclovir for 5 days. I also gave you refills if it comes back.   The person in charge of Nexplanon is not here right now, but I will call you later with the price.   Sincerely,   Dr. Clinton SawyerWilliamson

## 2013-04-25 NOTE — Progress Notes (Signed)
   Subjective:    Patient ID: Betty Bates, female    DOB: 1980-01-24, 33 y.o.   MRN: [id number]  HPI  Rash  Location: vagina Duration: 5 days Character: red bumps Changes: no Treatments tried: none History of similar rash: no Exposure/Infestation Concern: patient's husband with known genital herpes, but the patient has no a history of outbreaks and was tested for the HSV 2 antibody and pregnancy and was negative; she and her husband use condoms and he takes acyclovir daily for suppression therapy  Red Flags: New Drug: no Mucocutaneous Involvement: no Fever/Systemic Illness: no   LMP - 5th April, 1st period since Delivery in January, the patient would like to talk about more long term options for contraception because she does not want any more children  Review of Systems Negative for fever, chills, nausea, vomiting    Objective:   Physical Exam BP 116/77  Pulse 69  Temp(Src) 98.7 F (37.1 C) (Oral)  Ht 5\' 3"  (1.6 m)  Wt 138 lb 12.8 oz (62.959 kg)  BMI 24.59 kg/m2  Gen: well-appearing, nondistressed, female Skin: multiple discrete papules with healing lesions and crusting on her anterior thigh and perineal area without any active drainage, these areas are nontender and nonfluctuant GYN: vaginal mucosa without lesions or drainage     Assessment & Plan:

## 2013-04-25 NOTE — Assessment & Plan Note (Signed)
A: appearance is most consistent with a herpes outbreak and the patient has known exposure from her husband, although the lesions are crusted and cannot be cultured at this time P: given prescription for acyclovir to take for 5 days to shorten duration of outbreak, patient instructed to followup immediately if she develops another outbreak so they can be cultured and to begin another round of acyclovir

## 2013-04-25 NOTE — Assessment & Plan Note (Signed)
Patient desires long-term option for pregnancy prevention and would like Nexplanon. Discussed this option and told her the price. She will let me know her thoughts. Until that time she will continue to use condoms.

## 2013-10-15 ENCOUNTER — Ambulatory Visit: Payer: Self-pay

## 2013-11-17 ENCOUNTER — Encounter: Payer: Self-pay | Admitting: Family Medicine

## 2014-04-24 ENCOUNTER — Other Ambulatory Visit (HOSPITAL_COMMUNITY)
Admission: RE | Admit: 2014-04-24 | Discharge: 2014-04-24 | Disposition: A | Discharge status: Home / Self Care | Payer: Self-pay | Source: Ambulatory Visit | Attending: Family Medicine | Admitting: Family Medicine

## 2014-04-24 ENCOUNTER — Ambulatory Visit (INDEPENDENT_AMBULATORY_CARE_PROVIDER_SITE_OTHER): Payer: Self-pay | Admitting: Family Medicine

## 2014-04-24 ENCOUNTER — Encounter: Payer: Self-pay | Admitting: Family Medicine

## 2014-04-24 VITALS — BP 109/77 | HR 67 | Temp 97.8°F | Ht 63.0 in | Wt 131.0 lb

## 2014-04-24 DIAGNOSIS — F520 Hypoactive sexual desire disorder: Secondary | ICD-10-CM | POA: Insufficient documentation

## 2014-04-24 DIAGNOSIS — Z0000 Encounter for general adult medical examination without abnormal findings: Secondary | ICD-10-CM

## 2014-04-24 DIAGNOSIS — Z01419 Encounter for gynecological examination (general) (routine) without abnormal findings: Secondary | ICD-10-CM | POA: Insufficient documentation

## 2014-04-24 DIAGNOSIS — R1012 Left upper quadrant pain: Secondary | ICD-10-CM

## 2014-04-24 DIAGNOSIS — N898 Other specified noninflammatory disorders of vagina: Secondary | ICD-10-CM

## 2014-04-24 DIAGNOSIS — Z113 Encounter for screening for infections with a predominantly sexual mode of transmission: Secondary | ICD-10-CM | POA: Insufficient documentation

## 2014-04-24 LAB — POCT WET PREP (WET MOUNT): Clue Cells Wet Prep Whiff POC: NEGATIVE

## 2014-04-24 LAB — COMPREHENSIVE METABOLIC PANEL
ALT: 10 U/L (ref 0–35)
AST: 14 U/L (ref 0–37)
Albumin: 4.5 g/dL (ref 3.5–5.2)
Alkaline Phosphatase: 45 U/L (ref 39–117)
BUN: 17 mg/dL (ref 6–23)
CHLORIDE: 103 meq/L (ref 96–112)
CO2: 27 mEq/L (ref 19–32)
CREATININE: 0.61 mg/dL (ref 0.50–1.10)
Calcium: 9.1 mg/dL (ref 8.4–10.5)
Glucose, Bld: 88 mg/dL (ref 70–99)
Potassium: 3.6 mEq/L (ref 3.5–5.3)
Sodium: 140 mEq/L (ref 135–145)
TOTAL PROTEIN: 7.1 g/dL (ref 6.0–8.3)
Total Bilirubin: 0.4 mg/dL (ref 0.2–1.2)

## 2014-04-24 LAB — LIPID PANEL
Cholesterol: 183 mg/dL (ref 0–200)
HDL: 34 mg/dL — ABNORMAL LOW (ref >=46)
LDL CALC: 125 mg/dL — AB (ref 0–99)
TRIGLYCERIDES: 122 mg/dL (ref <150)
Total CHOL/HDL Ratio: 5.4 Ratio
VLDL: 24 mg/dL (ref 0–40)

## 2014-04-24 MED ORDER — BUPROPION HCL ER (XL) 300 MG PO TB24: 300.0000 mg | ORAL_TABLET | Freq: Every day | ORAL | Status: DC

## 2014-04-24 MED ORDER — SIMETHICONE 80 MG PO CHEW: 80.0000 mg | CHEWABLE_TABLET | Freq: Four times a day (QID) | ORAL | Status: DC | PRN

## 2014-04-24 NOTE — Assessment & Plan Note (Signed)
Low desire and difficulty achieving orgasms since 2014 birth of her last child, has tried lubrication, positioning, exercise. Not depressed but does have some mild depressive symptoms, no pain. - trial of bupropion - f/u in 1 month

## 2014-04-24 NOTE — Patient Instructions (Addendum)
We will call with the results of your tests today. If your abdominal pain is not getting better in the next few months or gets worse, please come back and see me. Otherwise, please come back in 1 year for another check-up.  Dolor abdominal en las mujeres (Abdominal Pain, Women) El dolor abdominal (en el estmago, la pelvis o el vientre) puede tener muchas causas. Es importante que le informe a su mdico:  La ubicacin del Engineer, miningdolor.  Viene y se va, o persiste todo el tiempo?  Hay situaciones que Location managerinician el dolor (comer ciertos alimentos, la actividad fsica)?  Tiene otros sntomas asociados al dolor (fiebre, nuseas, vmitos, diarrea)? Todo es de gran ayuda cuando se trata de hallar la causa del dolor. CAUSAS  Estmago: Infecciones por virus o bacterias, o lcera.  Intestino: Apendicitis (apndice inflamado), ileitis regional (enfermedad de Crohn), colitis ulcerosa (colon inflamado), sndrome del colon irritable, diverticulitis (inflamacin de los divertculos del colon) o cncer de estmago oo intestino.  Enfermedades de la vescula biliar o clculos.  Enfermedades renales, clculos o infecciones en el rin.  Infeccin o cncer del pncreas.  Fibromialgia (trastorno doloroso)  Enfermedades de los rganos femeninos:  Uterus: tero: fibroma (tumor no canceroso) o infeccin  Trompas de Falop[place]: infeccin o embarazo ectpico  En los ovarios, quistes o tumores.  Adherencias plvicas (tejido cicatrizal).  Endometriosis (el tejido que cubre el tero se desarrolla en la pelvis y los rganos plvicos).  Sndrome de Agricultural engineercongestin plvica (los rganos femeninos se llenan de sangre antes del periodo menstrual(  Dolor durante el periodo menstrual.  Dolor durante la ovulacin (al producir vulos).  Dolor al usar el DIU (dispositivo intrauterino para el control de la natalidad)  Psychologist, clinicalCncer en los rganos femeninos.  Dolor funcional (no est originado en una enfermedad, puede mejorar sin  tratamiento).  Dolor de origen psicolgico  Depresin. DIAGNSTICO Su mdico decidir la gravedad del dolor a travs del examen fsico  Anlisis de sangre  Radiografas  Ecografas  TC (tomografa computada, tipo especial de radiografas).  IMR (resonancia magntica)  Cultivos, en el caso una infeccin  Colon por enema de bario (se inserta una sustancia de contraste en el intestino grueso para mejorar la observacin con rayos X.)  Colonoscopa (observacin del intestino con un tubo luminoso).  Laparoscopa (examen del interior del abdomen con un tubo que tiene Intel Corporationuna luz).  Ciruga exploratoria abdominal mayor (se observa el abdomen realizando una gran incisin). TRATAMIENTO El tratamiento depender de la causa del problema.   Muchos de estos casos pueden controlarse y tratarse en casa.  Medicamentos de venta libre indicados por el mdico.  Medicamentos con receta.  Antibiticos, en caso de infeccin  Pldoras anticonceptivas, en el caso de perodos dolorosos o dolor al ovular.  Tratamiento hormonal, para la endometriosis  Inyecciones para bloqueo nervioso selectivo.  Fisioterapia.  Antidepresivos.  Consejos por parte de un psclogo o psiquiatra.  Ciruga mayor o menor. INSTRUCCIONES PARA EL CUIDADO DOMICILIARIO  No tome ni administre laxantes a menos que se lo haya indicado su mdico.  Tome analgsicos de venta libre slo si se lo ha indicado el profesional que lo asiste. No tome aspirina, ya que puede causar Apple Computermolestias en el estmago o hemorragias.  Consuma una dieta lquida (caldo o agua) segn lo indicado por el mdico. Progrese lentamente a una dieta blanda, segn la tolerancia, si el dolor se relaciona con el estmago o el intestino.  Tenga un termmetro y tmese la temperatura varias veces al da.  Haga reposo en  la cama y [place], si esto Research scientist (life sciences).  Evite las relaciones sexuales, Counsellor.  Evite las situaciones  estresantes.  Cumpla con las visitas y los anlisis de control, segn las indicaciones de su mdico.  Si el dolor no se [place] con los medicamentos o la [place], [place] tratar con:  Acupuntura.  Ejercicios de relajacin (yoga, meditacin).  Terapia grupal.  Psicoterapia. SOLICITE ATENCIN MDICA SI:  Nota que ciertos Pharmacist, community de [place].  El tratamiento indicado para Arboriculturist no Marketing executive.  Necesita analgsicos ms fuertes.  Quiere que le retiren el DIU.  Si se siente confundido o desfalleciente.  Presenta nuseas o vmitos.  Aparece una erupcin cutnea.  Sufre efectos adversos o una reaccin alrgica debido a los medicamentos que toma. SOLICITE ATENCIN MDICA DE INMEDIATO SI:  El dolor persiste o se agrava.  Tiene fiebre.  Siente el dolor slo en algunos sectores del abdomen. Si se localiza en la zona derecha, posiblemente podra tratarse de apendicitis. En un adulto, si se localiza en la regin inferior izquierda del abdomen, podra tratarse de colitis o diverticulitis.  Hay sangre en las heces (deposiciones de color rojo brillante o negro alquitranado), con o sin vmitos.  Usted presenta sangre en la orina.  Siente escalofros con o sin fiebre.  Se desmaya. ASEGRESE QUE:   Comprende estas instrucciones.  Controlar su enfermedad.  Solicitar ayuda de inmediato si no mejora o si empeora. Document Released: 04/20/2008 Document Revised: 03/27/2011 [facility] Patient Information 2015 [place], [place]. This information is not intended to replace advice given to you by your health care provider. Make sure you discuss any questions you have with your health care provider.

## 2014-04-24 NOTE — Progress Notes (Signed)
   Subjective:    Patient ID: Betty Bates, female    DOB: 03-18-1980, 34 y.o.   MRN: [id number]  HPI Pt presents for well woman exam.  Current issues include mild intermittent RUQ pain. Occasional mild constipation, no diarrhea, n/v.   Also reports low sexual desire and difficulty achieving orgasm since birth of her last child in 2014. Has tried working with partner on positions, lubrication, exercise. Not helpful. Denies pain with sexual activity or birth trauma.  Requests STD screening as she did get herpes from her husband in the past few years.  Review of Systems See HPI    Objective:   Physical Exam  Constitutional: She is oriented to person, place, and time. She appears well-developed and well-nourished. No distress.  HENT:  Head: Normocephalic and atraumatic.  Eyes: Conjunctivae are normal. Right eye exhibits no discharge. Left eye exhibits no discharge. No scleral icterus.  Cardiovascular: Normal rate, regular rhythm and normal heart sounds.   No murmur heard. Pulmonary/Chest: Effort normal and breath sounds normal. No respiratory distress. She has no wheezes.  Abdominal: Soft. Bowel sounds are normal. She exhibits no distension and no mass. There is no tenderness. There is no rebound and no guarding.  Genitourinary: Vagina normal and uterus normal. No labial fusion. There is no rash, tenderness, lesion or injury on the right labia. There is no rash, tenderness, lesion or injury on the left labia. Uterus is not deviated, not enlarged, not fixed and not tender. Cervix exhibits friability. Cervix exhibits no motion tenderness and no discharge. Right adnexum displays no mass, no tenderness and no fullness. Left adnexum displays no mass, no tenderness and no fullness. No erythema, tenderness or bleeding in the vagina. No foreign body around the vagina. No signs of injury around the vagina. No vaginal discharge found.  Small amount of blood on cervix and mildly friable (period  ended yesterday)  Musculoskeletal: She exhibits no edema.  Neurological: She is alert and oriented to person, place, and time.  Skin: Skin is warm and dry. No rash noted. She is not diaphoretic.  Psychiatric: She has a normal mood and affect. Her behavior is normal.  Nursing note and vitals reviewed.         Assessment & Plan:

## 2014-04-24 NOTE — Assessment & Plan Note (Signed)
Health maintenance up to date.  - lipid panel - STD screening - next pap in 2019 (cotest negative in July 2014)

## 2014-04-24 NOTE — Assessment & Plan Note (Signed)
Mild, cramping pain. Exam benign. Patient agrees it might be gas. - trial of simethicone - cbc, cmp ordered - return in 1-2 months if worse or not getting better

## 2014-04-25 LAB — CBC
HCT: 44.6 % (ref 36.0–46.0)
Hemoglobin: 15.2 g/dL — ABNORMAL HIGH (ref 12.0–15.0)
MCH: 30.6 pg (ref 26.0–34.0)
MCHC: 34.1 g/dL (ref 30.0–36.0)
MCV: 89.7 fL (ref 78.0–100.0)
RBC: 4.97 MIL/uL (ref 3.87–5.11)
RDW: 12.9 % (ref 11.5–15.5)
WBC: 6.7 10*3/uL (ref 4.0–10.5)

## 2014-04-27 LAB — CERVICOVAGINAL ANCILLARY ONLY
CHLAMYDIA, DNA PROBE: NEGATIVE
NEISSERIA GONORRHEA: NEGATIVE

## 2014-04-30 ENCOUNTER — Encounter: Payer: Self-pay | Admitting: *Deleted

## 2015-08-02 IMAGING — US US OB COMP +14 WK
1 series · 12 of 28 positions shown · non-contrast
Comparison: none

[Series 1: us ob comp +14 wk · 12 of 88 slices shown]
[im 4/88]
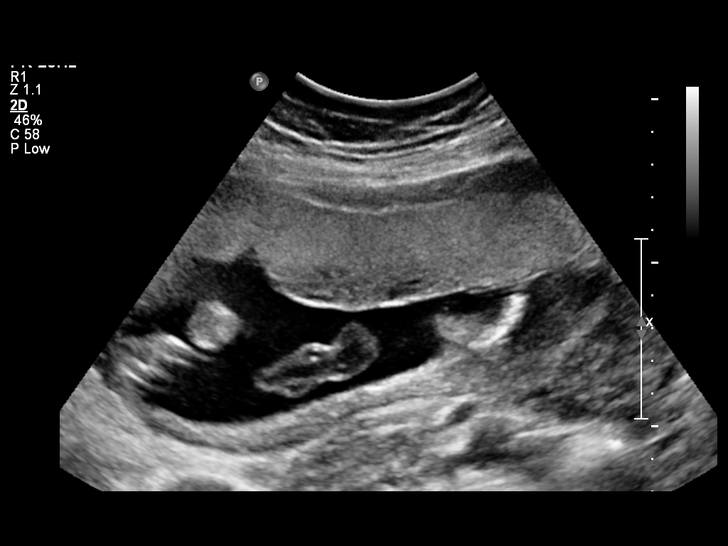
[im 10/88]
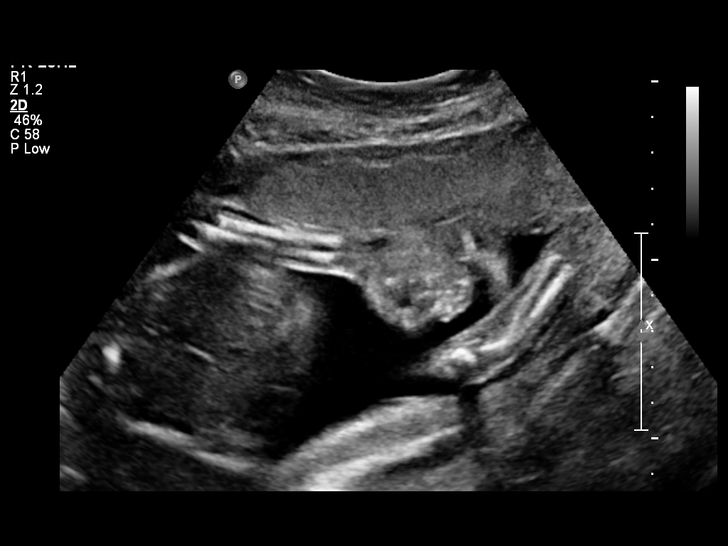
[im 17/88]
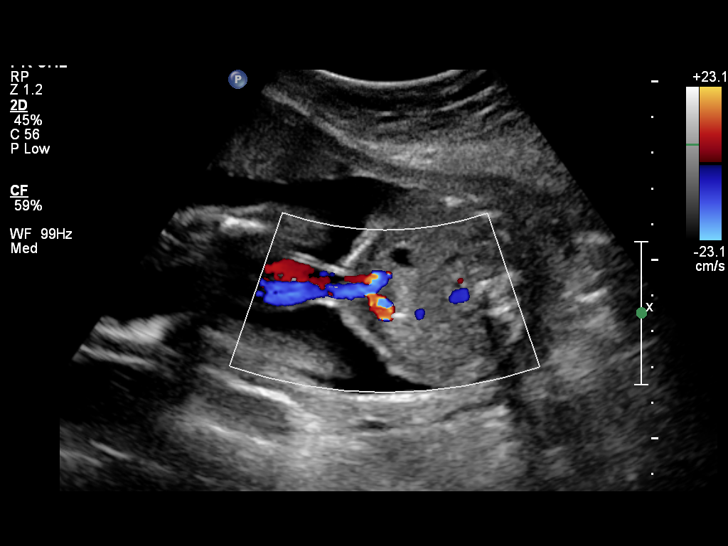
[im 26/88]
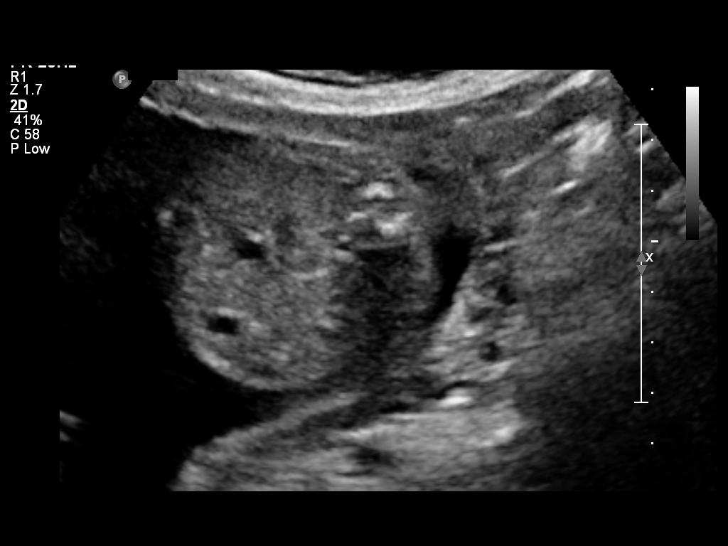
[im 33/88]
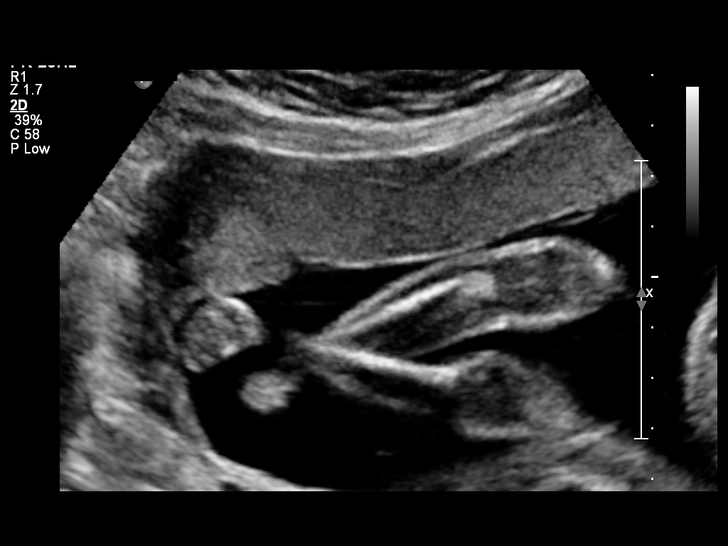
[im 39/88]
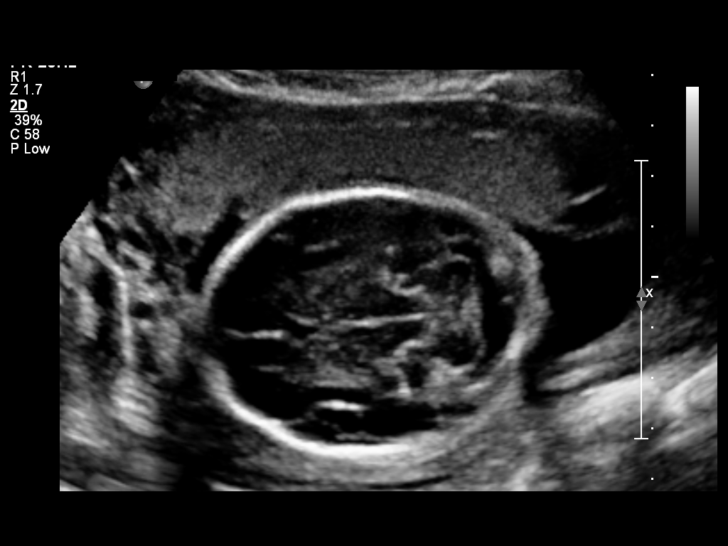
[im 49/88]
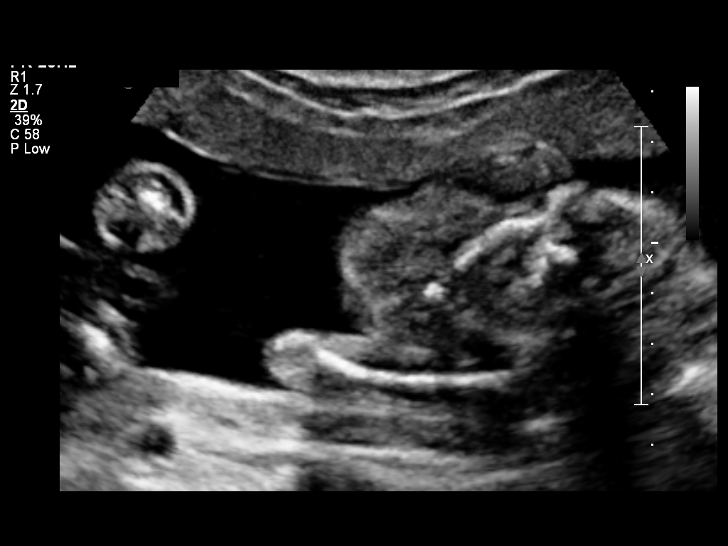
[im 55/88]
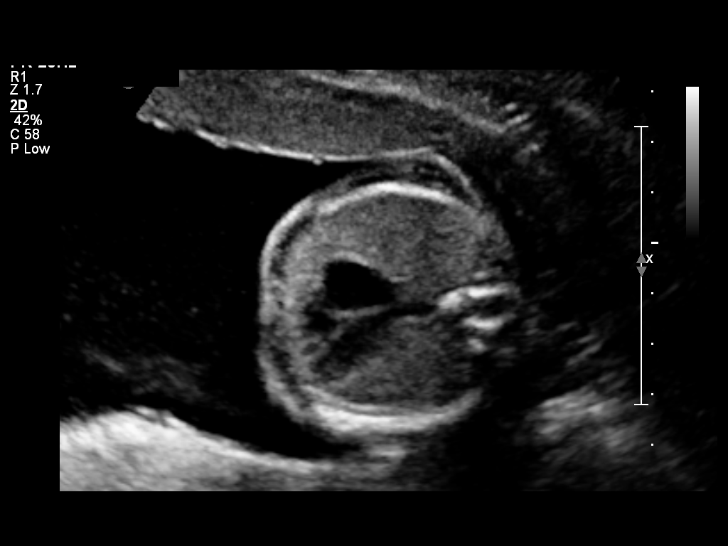
[im 62/88]
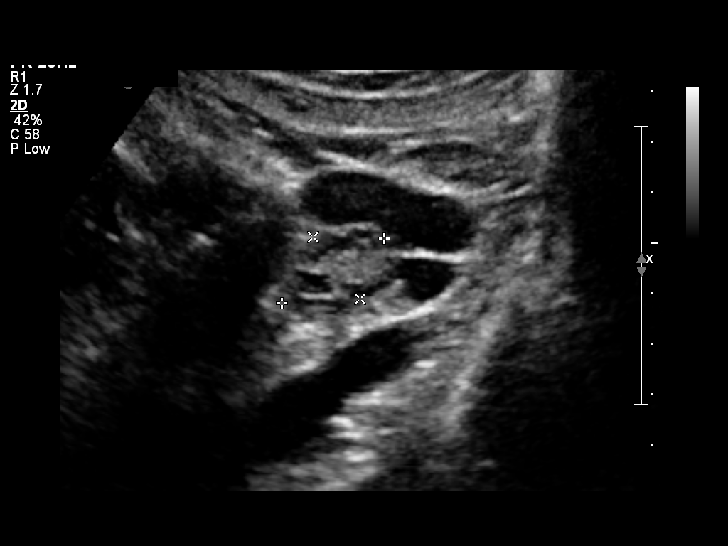
[im 71/88]
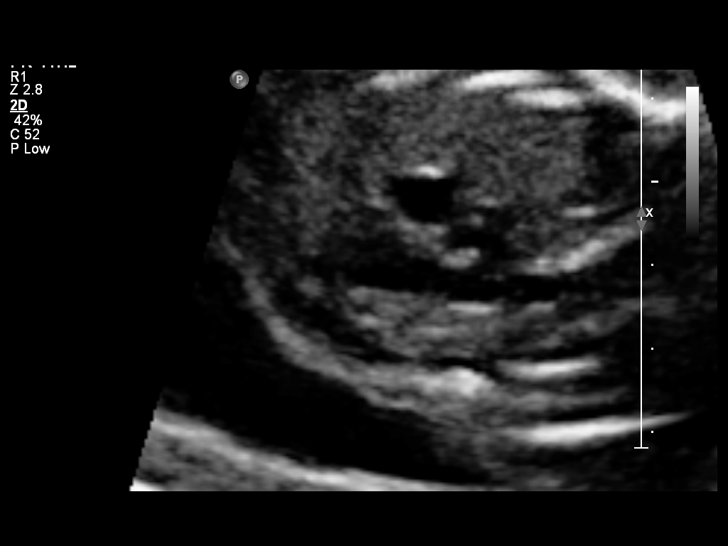
[im 78/88]
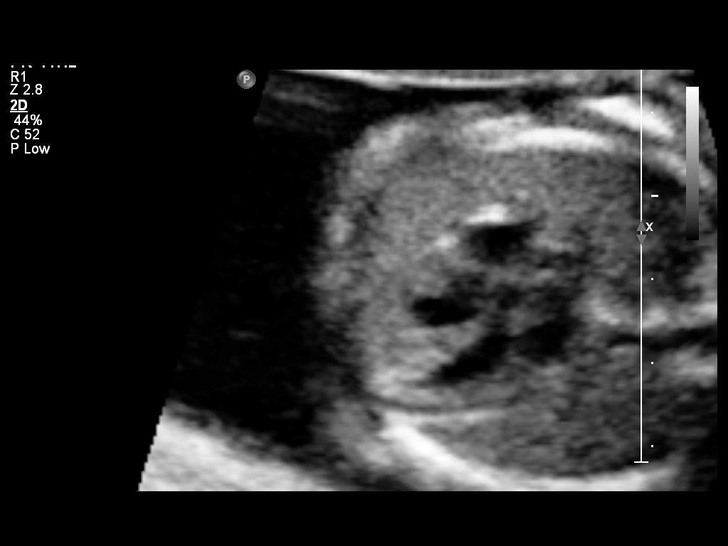
[im 84/88]
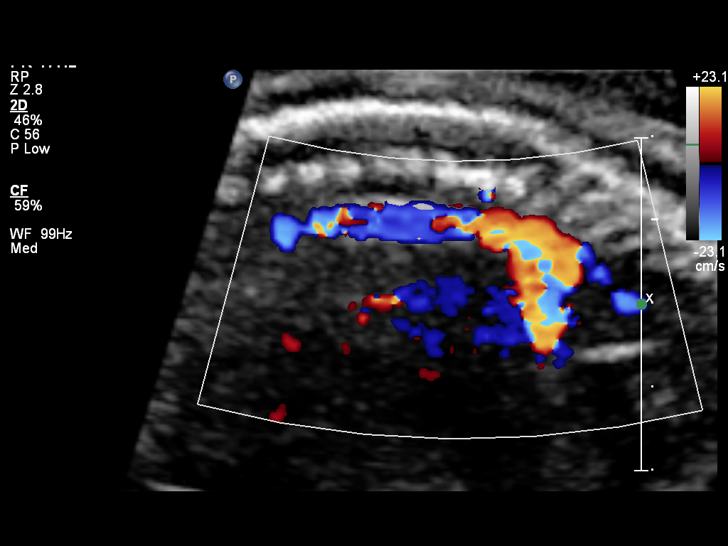

[12 of 28 positions shown; findings below may reference images not displayed]

OBSTETRICS REPORT
                      (Signed Final 09/09/2012 [DATE])

             PECINA

Service(s) Provided

 US OB COMP + 14 WK                                    76805.1
Indications

 Basic anatomic survey
Fetal Evaluation

 Num Of Fetuses:    1
 Fetal Heart Rate:  143                         bpm
 Cardiac Activity:  Observed
 Presentation:      Cephalic
 Placenta:          Anterior, above cervical os
 P. Cord            Visualized, central
 Insertion:

 Amniotic Fluid
 AFI FV:      Subjectively within normal limits
                                             Larg Pckt:   4.86   cm
Biometry

 BPD:       51  mm    G. Age:   21w 4d                CI:         73.3   70 - 86
                                                      FL/HC:      20.9   19.2 -

 HC:     189.3  mm    G. Age:   21w 2d        3  %    HC/AC:      1.09   1.05 -

 AC:     173.2  mm    G. Age:   22w 2d       32  %    FL/BPD:     77.5   71 - 87
 FL:      39.5  mm    G. Age:   22w 5d       45  %    FL/AC:      22.8   20 - 24
 CER:       23  mm    G. Age:   21w 4d       26  %

 Est. FW:     492  gm      1 lb 1 oz     44  %
Gestational Age

 LMP:           22w 4d       Date:   04/04/12                 EDD:   01/09/13
 U/S Today:     22w 0d                                        EDD:   01/13/13
 Best:          22w 4d    Det. By:   LMP  (04/04/12)          EDD:   01/09/13
Anatomy

 Cranium:          Appears normal         Aortic Arch:      Basic anatomy
                                                            exam per order
 Fetal Cavum:      Appears normal         Ductal Arch:      Appears normal
 Ventricles:       Appears normal         Diaphragm:        Appears normal
 Choroid Plexus:   Appears normal         Stomach:          Appears normal
 Cerebellum:       Appears normal         Abdomen:          Appears normal
 Posterior Fossa:  Appears normal         Abdominal Wall:   Appears nml (cord
                                                            insert, abd wall)
 Nuchal Fold:      Appears normal         Cord Vessels:     Appears normal (3
                                                            vessel cord)
 Face:             Appears normal         Kidneys:          Appear normal
                   (orbits and profile)
 Lips:             Appears normal         Bladder:          Appears normal
 Heart:            Appears normal         Spine:            Appears normal
                   (4CH, axis, and
                   situs)
 RVOT:             Appears normal         Lower             Appears normal
                                          Extremities:
 LVOT:             Appears normal         Upper             Appears normal
                                          Extremities:

 Other:  Nasal bone visualized. Female gender.
Targeted Anatomy

 Fetal Central Nervous System
 Cisterna Magna:
Cervix Uterus Adnexa

 Cervical Length:   3.9       cm

 Cervix:       Normal appearance by transabdominal scan.

 Left Ovary:   Within normal limits.
 Right Ovary:  Within normal limits.
Impression

 Single living IUP with assigned GA of 22w 4d. EGA by
 ultrasound correlates with assigned GA.
 No fetal anatomic abnormality is identified.
 Normal amniotic fluid volume and cervical length.

## 2017-06-25 NOTE — Progress Notes (Signed)
   [facility] Phone: [phone]   Date of Visit: 06/26/2017   HPI:  Dizziness/Fatigue:  - reports of the episodes of dizziness this month. It is difficult for patient to describe the dizziness. She felt like she was "moving" and she had to grab something. This would last for 15-20 minutes.  - she also felt like her vision was blurry sometimes. No shortness of breath, chest pain, palpitations.  - occurred when she would stand/walk from a seated position. Does not recall this happening at other situations. The episodes happened at home - reports she feels tired. This is chronic.  - she reports that she feels a little depressed but not all the time. Her husband travels a lot for work so she does not get to see him much. She mainly takes care of their kids while he is gone. This is the period when she sometimes feels down. Sometimes she has difficulty concentrating. She usually goes something she enjoys doing when she has these feelings.  - denies SI/HI - no unintentional weight loss. NO fevers or night sweats - has chronic mild intermittent HAs which change locations. These HA are relieved with Advil. No night time headaches.  - she does not exercise regularly.   ROS: See HPI.  PMFSH:  PMH: Hypoactive Sexual Desire  PHYSICAL EXAM: BP 102/70   Pulse 93   Temp 98.7 F (37.1 C) (Oral)   Wt 140 lb (63.5 kg)   SpO2 98%   BMI 24.80 kg/m  GEN: NAD HEENT: Atraumatic, normocephalic, neck supple, EOMI, sclera clear  CV: RRR, no murmurs, rubs, or gallops PULM: CTAB, normal effort SKIN: No rash or cyanosis; warm and well-perfused EXTR: No lower extremity edema or calf tenderness PSYCH: Mood and affect euthymic, normal rate and volume of speech NEURO: Awake, alert, no focal deficits grossly, normal speech  Orthostatic vitals were normal.   ASSESSMENT/PLAN:  Fatigue:  Will obtain CBC and TSH for further evaluation. Vitals and exam are within normal limits.  Possibly due to the intermittent depressive symptoms. Does not meet criteria for MDD or dysthymia. Recommended regular physical activity. Follow up in 1-2 months   Dizziness:  Unclear in etiology. Considering orthostatic symptoms as her symptoms happened after standing from seated position. However, orthostatic vitals were normal today. No syncopal episodes or other symptoms to suggest cardiac etiology. Additionally, no family history of early heart disease. Three episodes in the past month. Recommended to monitor and stay hydrated through out the day. Follow up if symptoms persist.   Palma HolterKanishka G Danyiel Crespin, MD PGY 3 [facility]

## 2017-06-26 ENCOUNTER — Other Ambulatory Visit: Payer: Self-pay

## 2017-06-26 ENCOUNTER — Encounter: Payer: Self-pay | Admitting: Internal Medicine

## 2017-06-26 ENCOUNTER — Ambulatory Visit (INDEPENDENT_AMBULATORY_CARE_PROVIDER_SITE_OTHER): Payer: Self-pay | Admitting: Internal Medicine

## 2017-06-26 VITALS — BP 102/70 | HR 93 | Temp 98.7°F | Wt 140.0 lb

## 2017-06-26 DIAGNOSIS — R42 Dizziness and giddiness: Secondary | ICD-10-CM

## 2017-06-26 DIAGNOSIS — R5383 Other fatigue: Secondary | ICD-10-CM

## 2017-06-26 NOTE — Patient Instructions (Signed)
1) lets get some blood work to further evaluate your fatigue 2) please stay hydrated through out the day and try to start exercising regularly  Please follow up in 3-4 weeks if your symptoms continue

## 2017-06-27 ENCOUNTER — Encounter: Payer: Self-pay | Admitting: Internal Medicine

## 2017-06-27 ENCOUNTER — Telehealth: Payer: Self-pay | Admitting: Internal Medicine

## 2017-06-27 LAB — CBC
HEMOGLOBIN: 14.2 g/dL (ref 11.1–15.9)
Hematocrit: 43.1 % (ref 34.0–46.6)
MCH: 30.2 pg (ref 26.6–33.0)
MCHC: 32.9 g/dL (ref 31.5–35.7)
MCV: 92 fL (ref 79–97)
RBC: 4.7 x10E6/uL (ref 3.77–5.28)
RDW: 13.1 % (ref 12.3–15.4)
WBC: 7.8 10*3/uL (ref 3.4–10.8)

## 2017-06-27 LAB — TSH: TSH: 0.561 u[IU]/mL (ref 0.450–4.500)

## 2017-06-27 NOTE — Telephone Encounter (Signed)
Called patient to inform of normal labs.

## 2017-06-28 ENCOUNTER — Encounter: Payer: Self-pay | Admitting: Internal Medicine

## 2017-11-13 ENCOUNTER — Other Ambulatory Visit: Payer: Self-pay

## 2017-11-13 ENCOUNTER — Ambulatory Visit (INDEPENDENT_AMBULATORY_CARE_PROVIDER_SITE_OTHER): Payer: Self-pay | Admitting: Family Medicine

## 2017-11-13 VITALS — BP 110/64 | HR 81 | Temp 98.1°F | Wt 138.0 lb

## 2017-11-13 DIAGNOSIS — N644 Mastodynia: Secondary | ICD-10-CM | POA: Insufficient documentation

## 2017-11-13 NOTE — Progress Notes (Signed)
   Subjective:    Patient ID: Betty Bates, female    DOB: 1980/04/20, 37 y.o.   MRN: [id number]   CC: Left breast pain  HPI: Patient is a 37 year old female who presents today complaining of left breast pain.  Patient reported symptoms started 3 days ago when she noted bilateral breast pain.  She reports that right breast pain resolved but she continued to experience mild left breast pain.  Patient reports that she was able to palpate a mass around her left areola.  Pain has improved daily since she has noticed it 3 days ago.  Patient reports that she almost has no pain today but wanted to be evaluated to rule out any serious issues.  She denies any erythema, drainage, trauma, fever, chills.  She is well-appearing.  Smoking status reviewed   ROS: all other systems were reviewed and are negative other than in the HPI   Past Medical History:  Diagnosis Date  . Medical history non-contributory     Past Surgical History:  Procedure Laterality Date  . NO PAST SURGERIES      Past medical history, surgical, family, and social history reviewed and updated in the EMR as appropriate.  Objective:  BP 110/64   Pulse 81   Temp 98.1 F (36.7 C) (Oral)   Wt 138 lb (62.6 kg)   LMP 10/22/2017 (Approximate)   SpO2 98%   Breastfeeding? No   BMI 24.45 kg/m   Vitals and nursing note reviewed  General: NAD, pleasant, able to participate in exam Cardiac: RRR, normal heart sounds, no murmurs. 2+ radial and PT pulses bilaterally Respiratory: CTAB, normal effort, No wheezes, rales or rhonchi.  All 4 quadrants examined with no nodules or mass felt with palpation.  No nodule palpated in the left axilla.  No erythema or drainage noted.  Areola appears normal with an everted nipple.  No distortion or difference in size in comparison to right breast.  Right breast exam is within normal limits. Abdomen: soft, nontender, nondistended, no hepatic or splenomegaly, +BS Extremities: no edema or  cyanosis. WWP. Skin: warm and dry, no rashes noted Neuro: alert and oriented x4, no focal deficits Psych: Normal affect and mood   Assessment & Plan:   Breast pain, left Patient presents with left breast pain for the past 3 days.  Initially thought was bilateral.  Pain has improved significantly in the past 3 days.  Exam is within normal limits with no concerning findings. Patient denies any similar presentation in the past. No mass could be palpated on exam, no lymph nodes in the axilla or skin changes.  Given bilateral tenderness initially, pain is likely secondary to menstrual cycle and hormonal changes.   We will continue to monitor.  Patient is not due for mammogram.  She will follow-up with PCP as needed.    Lovena Neighbours, MD [facility] PGY-3

## 2017-11-13 NOTE — Patient Instructions (Signed)
Sensibilidad en las mamas  (Breast Tenderness)  La sensibilidad en las mamas es un problema frecuente en las mujeres de todas las edades. y puede causar molestias leves o dolor intenso. Sus causas son variadas. Su médico determinará la causa probable de la sensibilidad mediante el examen de las mamas, las preguntas sobre los síntomas y la indicación de algunos estudios. Por lo general, la sensibilidad en las mamas no significa que tenga cáncer de mama.  INSTRUCCIONES PARA EL CUIDADO EN EL HOGAR   A menudo, la sensibilidad en las mamas puede tratarse en el hogar. Puede intentar lo siguiente:  · Probarse un nuevo sostén que le brinde más sujeción, especialmente mientras hace actividad física.  · Usar un sostén con mejor sujeción o uno deportivo mientras duerme cuando las mamas están muy sensibles.  · Si tiene una lesión mamaria, aplique hielo en la zona:  ? Ponga el hielo en una bolsa plástica.  ? Colóquese una toalla entre la piel y la bolsa de hielo.  ? Deje el hielo durante 20 minutos y aplíquelo 2 a 3 veces por día.  · Si tiene las mamas repletas de leche debido a la lactancia, intente lo siguiente:  ? Extráigase leche manualmente o con un sacaleche.  ? Aplíquese una compresa tibia en las mamas para ayudar a la descarga.  · Tome analgésicos de venta libre si su médico lo autoriza.  · Tome otros medicamentos que su médico le recete, entre ellos, antibióticos o anticonceptivos.  A largo plazo, puede aliviar la sensibilidad en las mamas si hace lo siguiente:  · Disminuye el consumo de cafeína.  · Disminuye la cantidad de grasa de la dieta.  Lleva un registro de los días y las horas cuando tiene mayor sensibilidad en las mamas. Esto será de ayuda para que usted y su médico encuentren la causa de la sensibilidad y cómo aliviarla. Además, aprenda cómo examinarse las mamas en casa. Esto la ayudará a palpar un crecimiento o un bulto fuera de lo normal que podría causar la sensibilidad.  SOLICITE ATENCIÓN MÉDICA SI:    · Cualquier zona de la mama está dura, enrojecida y caliente al tacto. Puede ser un signo de infección.  · Hay secreción de los pezones (y no está amamantando). En especial, vigile la secreción de sangre o pus.  · Tiene fiebre, además de sensibilidad en las mamas.  · Tiene un bulto nuevo o doloroso en la mama que no desaparece después de la finalización del período menstrual.  · Ha intentando controlar el dolor en casa, pero no desaparece.  · El dolor de la mama es más intenso o le dificulta hacer las cosas que hace habitualmente durante el día.  Esta información no tiene como fin reemplazar el consejo del médico. Asegúrese de hacerle al médico cualquier pregunta que tenga.  Document Released: 10/23/2012  Elsevier Interactive Patient Education © 2017 Elsevier Inc.

## 2017-11-13 NOTE — Assessment & Plan Note (Signed)
Patient presents with left breast pain for the past 3 days.  Initially thought was bilateral.  Pain has improved significantly in the past 3 days.  Exam is within normal limits with no concerning findings. Patient denies any similar presentation in the past. No mass could be palpated on exam, no lymph nodes in the axilla or skin changes.  Given bilateral tenderness initially, pain is likely secondary to menstrual cycle and hormonal changes.   We will continue to monitor.  Patient is not due for mammogram.  She will follow-up with PCP as needed.

## 2018-03-12 ENCOUNTER — Encounter: Payer: Self-pay | Admitting: Student in an Organized Health Care Education/Training Program

## 2018-04-16 ENCOUNTER — Ambulatory Visit: Payer: Self-pay | Admitting: Student in an Organized Health Care Education/Training Program

## 2018-10-15 ENCOUNTER — Other Ambulatory Visit (HOSPITAL_COMMUNITY)
Admission: RE | Admit: 2018-10-15 | Discharge: 2018-10-15 | Disposition: A | Discharge status: Home / Self Care | Payer: Self-pay | Source: Ambulatory Visit | Attending: Family Medicine | Admitting: Family Medicine

## 2018-10-15 ENCOUNTER — Other Ambulatory Visit: Payer: Self-pay

## 2018-10-15 ENCOUNTER — Encounter: Payer: Self-pay | Admitting: Student in an Organized Health Care Education/Training Program

## 2018-10-15 ENCOUNTER — Ambulatory Visit (INDEPENDENT_AMBULATORY_CARE_PROVIDER_SITE_OTHER): Payer: Self-pay | Admitting: Student in an Organized Health Care Education/Training Program

## 2018-10-15 VITALS — BP 112/72 | HR 81 | Wt 144.4 lb

## 2018-10-15 DIAGNOSIS — Z124 Encounter for screening for malignant neoplasm of cervix: Secondary | ICD-10-CM | POA: Insufficient documentation

## 2018-10-15 DIAGNOSIS — N898 Other specified noninflammatory disorders of vagina: Secondary | ICD-10-CM

## 2018-10-15 DIAGNOSIS — Z23 Encounter for immunization: Secondary | ICD-10-CM | POA: Insufficient documentation

## 2018-10-15 LAB — POCT WET PREP (WET MOUNT)
Clue Cells Wet Prep Whiff POC: NEGATIVE
Trichomonas Wet Prep HPF POC: ABSENT
WBC, Wet Prep HPF POC: >20

## 2018-10-15 NOTE — Progress Notes (Signed)
   Subjective:    Patient ID: Betty Bates, female    DOB: 11/24/1980, 38 y.o.   MRN: [id number]  CC: Cervical cancer screening  HPI:  Patient presents today for her Pap smear last one was 2017 and was negative for malignancy or HPV.  Patient states that her last menstrual period was approximately 2 weeks ago and was normal for her.  She has the Nexplanon birth control and has irregular periods with that device.  She will usually go 15 to 20 days in between her.'s.  Patient is uninterested in discussing alternate options for birth control at this time.  Patient denies having any concern for STDs and declines routine screening at this time.  She denies any pruritus, dysuria, abnormal discharge or odor. She is agreeable to getting her flu shot today. She has no other concerns at this time.  Smoking status reviewed   ROS: pertinent noted in the HPI   I have personally reviewed pertinent past medical history, surgical, family, and social history as appropriate.  Objective:  BP 112/72   Pulse 81   Wt 144 lb 6.4 oz (65.5 kg)   SpO2 99%   BMI 25.58 kg/m   Vitals and nursing note reviewed  General: NAD, pleasant, able to participate in exam Pelvic: Please see clinical images for further details.  Patient had abnormal growths within vaginal meatus and enlarged urethral opening which was severely tender to palpation.  Exam was painful to patient.  Cervix was friable and bled with minimal agitation.  Cervical loss was open and had friable polyps protruding slightly.  Presence of white mucoid discharge.  Negative for adnexal masses on bimanual exam. Extremities: no edema or cyanosis. Skin: warm and dry, no rashes noted Neuro: alert, no obvious focal deficits Psych: Normal affect and mood       Assessment & Plan:   Cervical cancer screening Internal pelvic exam abnormalities and tenderness on exam. -Obtained wet prep (which was normal) and Pap smear sample. -Obtained clinical  images to be able to monitor progression. -Discussed with patient the findings on exam and indications for further work-up. -Scheduled patient for colposcopy clinic.  Flu vaccine need Attempted to administer flu shot today but patient is uninsured so recommended to receive at pharmacy at lower cost.  Orders Placed This Encounter  Procedures  . POCT Wet Prep [facility])  Pap smear  I independently examined pertinent imaging in relation to problem.  Doristine Mango, [facility] PGY-2

## 2018-10-15 NOTE — Assessment & Plan Note (Addendum)
Attempted to administer flu shot today but patient is uninsured so recommended to receive at pharmacy at lower cost.

## 2018-10-15 NOTE — Assessment & Plan Note (Addendum)
Internal pelvic exam abnormalities and tenderness on exam. -Obtained wet prep (which was normal) and Pap smear sample. -Obtained clinical images to be able to monitor progression. -Discussed with patient the findings on exam and indications for further work-up. -Scheduled patient for colposcopy clinic.

## 2018-10-15 NOTE — Patient Instructions (Signed)
It was a pleasure to see you today!  To summarize our discussion for this visit:  We completed your Pap smear today which showed some abnormal polyps from your cervix and in your vaginal canal.  I took a sample of your cervix to test for cancer and I would also like to refer you to our colposcopy clinic for a better look and sample.  We are giving you your flu shot today.  Some additional health maintenance measures we should update are: Health Maintenance Due  Topic Date Due  . PAP SMEAR-Modifier  07/19/2015  . INFLUENZA VACCINE  08/17/2018  .    Please return to our clinic to see me after the results of your colposcopy have been returned.  Call the clinic at [phone] if your symptoms worsen or you have any concerns.   Thank you for allowing me to take part in your care,  Dr. Doristine Mango

## 2018-10-18 LAB — CYTOLOGY - PAP
Diagnosis: NEGATIVE
High risk HPV: NEGATIVE

## 2018-11-11 NOTE — Progress Notes (Signed)
   Subjective:    Patient ID: Betty Bates, female    DOB: [birth year], 38 y.o.   MRN: [id number]   CC: colposcopy   HPI: Colposcopy  Patient seen on 9/29 by PCP.  At that time cervical exam was abnormal.  Patient had abnormal growths within the vaginal meatus as well as friable cervix with bleeding on minimal agitation.  Cervix also had friable polyps protruding.  Pap smear from that visit was normal and negative for HPV.  Patient reports only minimal tenderness with intercourse.  States that if she changes positions the pain goes away.  Irregular bleeding Patient notes that she has had some irregular bleeding.  States that she thinks it has been since her Nexplanon replacement.  States that she has been on her period for 4 weeks now.  Would like her Nexplanon removed.   Objective:  BP 105/65   Pulse 94   Temp 98.7 F (37.1 C) (Oral)   Wt 140 lb (63.5 kg)   LMP 10/17/2018   SpO2 99%   BMI 24.80 kg/m  Vitals and nursing note reviewed  General: well nourished, in no acute distress HEENT: normocephalic, no scleral icterus or conjunctival pallor  Cardiac: Regular rate Respiratory: no increased work of breathing Extremities: no edema or cyanosis.  Skin: warm and dry, no rashes noted Neuro: alert and oriented, no focal deficits Female genitalia: normal external genitalia, vulva, vagina, cervix, uterus and adnexa. Small polyp noted along the 5 o'clock region of cervical opening.    Assessment & Plan:    Cervical polyp Patient given informed consent, signed copy in the chart.  Placed in lithotomy position. Cervix viewed with speculum and colposcope   Colposcopy adequate (entire squamocolumnar junctions seen  in entirety) ?  yes Punctation? no Mosaicism?  no Abnormal vasculature?  no Biopsies? no ECC? no Complications? no  COMMENTS:  Patient was given post procedure instructions.  Polyp likely benign finding. Advised to follow up if pain with intercourse or  bleeding with intercourse   Abnormal uterine bleeding (AUB) Patient mentioned AUB. States since Nexplanon placement she has had irregular menses. Current menses lasting 4 weeks. She would like to remove Nexplanon. I advised she follow up with PCP for AUB and Nexplanon removal. Patient states she would schedule this appointment before leaving clinic.     Return if symptoms worsen or fail to improve.   Caroline More, DO, PGY-3

## 2018-11-14 ENCOUNTER — Ambulatory Visit (INDEPENDENT_AMBULATORY_CARE_PROVIDER_SITE_OTHER): Payer: Self-pay | Admitting: Family Medicine

## 2018-11-14 ENCOUNTER — Other Ambulatory Visit: Payer: Self-pay

## 2018-11-14 DIAGNOSIS — N841 Polyp of cervix uteri: Secondary | ICD-10-CM | POA: Insufficient documentation

## 2018-11-14 DIAGNOSIS — N939 Abnormal uterine and vaginal bleeding, unspecified: Secondary | ICD-10-CM | POA: Insufficient documentation

## 2018-11-14 NOTE — Progress Notes (Signed)
I have seen and examined this patient, reviewed her chart.  I supervised procedures by Dr. Tammi Klippel.  Agree with assessment and plan.

## 2018-11-14 NOTE — Assessment & Plan Note (Signed)
Patient mentioned AUB. States since Nexplanon placement she has had irregular menses. Current menses lasting 4 weeks. She would like to remove Nexplanon. I advised she follow up with PCP for AUB and Nexplanon removal. Patient states she would schedule this appointment before leaving clinic.

## 2018-11-14 NOTE — Assessment & Plan Note (Signed)
Patient given informed consent, signed copy in the chart.  Placed in lithotomy position. Cervix viewed with speculum and colposcope   Colposcopy adequate (entire squamocolumnar junctions seen  in entirety) ?  yes Punctation? no Mosaicism?  no Abnormal vasculature?  no Biopsies? no ECC? no Complications? no  COMMENTS:  Patient was given post procedure instructions.  Polyp likely benign finding. Advised to follow up if pain with intercourse or bleeding with intercourse

## 2018-11-14 NOTE — Patient Instructions (Signed)
Colposcopy, Care After This sheet gives you information about how to care for yourself after your procedure. Your doctor may also give you more specific instructions. If you have problems or questions, contact your doctor. What can I expect after the procedure? If you did not have a tissue sample removed (did not have a biopsy), you may only have some spotting for a few days. You can go back to your normal activities. If you had a tissue sample removed, it is common to have:  Soreness and pain. This may last for a few days.  Light-headedness.  Mild bleeding from your vagina or dark-colored, grainy discharge from your vagina. This may last for a few days. You may need to wear a sanitary pad.  Spotting for at least 48 hours after the procedure. Follow these instructions at home:   Take over-the-counter and prescription medicines only as told by your doctor. Ask your doctor what medicines you can start taking again. This is very important if you take blood-thinning medicine.  Do not drive or use heavy machinery while taking prescription pain medicine.  For 3 days, or as long as your doctor tells you, avoid: ? Douching. ? Using tampons. ? Having sex.  If you use birth control (contraception), keep using it.  Limit activity for the first day after the procedure. Ask your doctor what activities are safe for you.  It is up to you to get the results of your procedure. Ask your doctor when your results will be ready.  Keep all follow-up visits as told by your doctor. This is important. Contact a doctor if:  You get a skin rash. Get help right away if:  You are bleeding a lot from your vagina. It is a lot of bleeding if you are using more than one pad an hour for 2 hours in a row.  You have clumps of blood (blood clots) coming from your vagina.  You have a fever.  You have chills  You have pain in your lower belly (pelvic area).  You have signs of infection, such as vaginal  discharge that is: ? Different than usual. ? Yellow. ? Bad-smelling.  You have very pain or cramps in your lower belly that do not get better with medicine.  You feel light-headed.  You feel dizzy.  You pass out (faint). Summary  If you did not have a tissue sample removed (did not have a biopsy), you may only have some spotting for a few days. You can go back to your normal activities.  If you had a tissue sample removed, it is common to have mild pain and spotting for 48 hours.  For 3 days, or as long as your doctor tells you, avoid douching, using tampons and having sex.  Get help right away if you have bleeding, very bad pain, or signs of infection. This information is not intended to replace advice given to you by your health care provider. Make sure you discuss any questions you have with your health care provider. Document Released: 06/21/2007 Document Revised: 12/15/2016 Document Reviewed: 09/22/2015 Elsevier Patient Education  2020 Elsevier Inc.  

## 2018-12-02 ENCOUNTER — Ambulatory Visit (INDEPENDENT_AMBULATORY_CARE_PROVIDER_SITE_OTHER): Payer: Self-pay | Admitting: Student in an Organized Health Care Education/Training Program

## 2018-12-02 ENCOUNTER — Other Ambulatory Visit: Payer: Self-pay

## 2018-12-02 DIAGNOSIS — Z3046 Encounter for surveillance of implantable subdermal contraceptive: Secondary | ICD-10-CM

## 2018-12-02 NOTE — Patient Instructions (Signed)
It was a pleasure to see you today!  To summarize our discussion for this visit:  We have removed her Nexplanon.  And you are choosing to use condoms for contraception now.  Please let us know if you see any signs of infection or prolonged bleeding at the site that I removed your Nexplanon.   Call the clinic at [phone] if your symptoms worsen or you have any concerns.   Thank you for allowing me to take part in your care,  Dr. Doristine Mango Nexplanon Instructions After Removal   Keep bandage clean and dry for 24 hours   May use ice/Tylenol/Ibuprofen for soreness or pain   If you develop fever, drainage or increased warmth from incision site-contact office immediately

## 2018-12-02 NOTE — Progress Notes (Signed)
   Subjective:    Patient ID: Betty Bates, female    DOB: 1980-11-13, 38 y.o.   MRN: [id number]  CC: nexplanon removal  HPI:  Denies complications of the device. She would like to use condoms. Does not desire pregnancy at this time. Consented to removal of nexplanon.  Smoking status reviewed   ROS: pertinent noted in the HPI   I have personally reviewed pertinent past medical history, surgical, family, and social history as appropriate. Objective:  BP 100/75   Pulse 77   Wt 138 lb 12.8 oz (63 kg)   SpO2 98%   BMI 24.59 kg/m   Vitals and nursing note reviewed  General: NAD, pleasant, able to participate in exam Extremities: no edema or cyanosis. L medial arm at groove of bicep palpated device very deep. Skin: warm and dry, no rashes noted Neuro: alert, no obvious focal deficits Psych: Normal affect and mood  Assessment & Plan:   Nexplanon removal Informed consent obtained and placed in chart.  Proper time-out conducted.  Implanted device was located with manual palpation. Using Iodine cleaned area of removal x 3 and then wiped clear with sterile alcohol pad to prepare patient in usual sterile fashion. 4 cc lidocaine with epinephrine used for anesthesia at distal end of device.   After proper anestheasia achieved, ~ 24mm incision made in epidermis. Device was deep and located with forceps. Grasped device and was removed without complication. Hemostasis at site was achieved with manual pressure. Device was inspected to ensure completely removed. Patient tolerated procedure well. Placed 1 steri-strip and then applied pressure packaging.  No complications, no bleeding.  Patient given instructions regarding fevers, signs of infection, nausea/vomiting, or other reasons to call/return to clinic.     Doristine Mango, [facility] PGY-2

## 2018-12-07 DIAGNOSIS — Z3046 Encounter for surveillance of implantable subdermal contraceptive: Secondary | ICD-10-CM | POA: Insufficient documentation

## 2018-12-07 NOTE — Assessment & Plan Note (Signed)
Informed consent obtained and placed in chart.  Proper time-out conducted.  Implanted device was located with manual palpation. Using Iodine cleaned area of removal x 3 and then wiped clear with sterile alcohol pad to prepare patient in usual sterile fashion. 4 cc lidocaine with epinephrine used for anesthesia at distal end of device.   After proper anestheasia achieved, ~ 63mm incision made in epidermis. Device was deep and located with forceps. Grasped device and was removed without complication. Hemostasis at site was achieved with manual pressure. Device was inspected to ensure completely removed. Patient tolerated procedure well. Placed 1 steri-strip and then applied pressure packaging.  No complications, no bleeding.  Patient given instructions regarding fevers, signs of infection, nausea/vomiting, or other reasons to call/return to clinic.

## 2019-06-05 ENCOUNTER — Other Ambulatory Visit: Payer: Self-pay

## 2019-06-05 ENCOUNTER — Other Ambulatory Visit (HOSPITAL_COMMUNITY)
Admission: RE | Admit: 2019-06-05 | Discharge: 2019-06-05 | Disposition: A | Discharge status: Home / Self Care | Payer: Self-pay | Source: Ambulatory Visit | Attending: Family Medicine | Admitting: Family Medicine

## 2019-06-05 ENCOUNTER — Ambulatory Visit (INDEPENDENT_AMBULATORY_CARE_PROVIDER_SITE_OTHER): Payer: Self-pay | Admitting: Family Medicine

## 2019-06-05 VITALS — BP 110/75 | HR 87 | Ht 62.0 in | Wt 137.4 lb

## 2019-06-05 DIAGNOSIS — R102 Pelvic and perineal pain: Secondary | ICD-10-CM | POA: Insufficient documentation

## 2019-06-05 DIAGNOSIS — R1032 Left lower quadrant pain: Secondary | ICD-10-CM | POA: Insufficient documentation

## 2019-06-05 LAB — POCT WET PREP (WET MOUNT)
Clue Cells Wet Prep Whiff POC: NEGATIVE
Trichomonas Wet Prep HPF POC: ABSENT

## 2019-06-05 LAB — POCT URINE PREGNANCY: Preg Test, Ur: NEGATIVE

## 2019-06-05 NOTE — Progress Notes (Signed)
    SUBJECTIVE:   CHIEF COMPLAINT / HPI:   Abdominal pain  Patient reports 2-3 months of abdominal pain. Abdominal pain is in LLQ. In the beginning it felt like small "pressing" pain. Has progressively worsened. Has not been seen in the past because pain usually self resolves. Has been trying to track food to see if this was the cause but no specific foods cause pain. No worsening factors. Has not taken any medications for pain. Pain usually lasts for 2 days, usually a mild pain. Has daily BMs, they are brown without blood, soft. Reports normal urination, no hematuria. Patient is sexually active. Patient had recent intercourse on Sunday. No birth control. Patient has irregular periods. LMP was last month, unclear dates. No abdominal surgeries. Reports normal appetite.   PERTINENT  PMH / PSH: AUB, cervical polyp  OBJECTIVE:   BP 110/75   Pulse 87   Ht 5\' 2"  (1.575 m)   Wt 137 lb 6.4 oz (62.3 kg)   SpO2 100%   BMI 25.13 kg/m   Gen: awake and alert, NAD GI: soft, LLQ tenderness, non distended, bowel sounds present Ext: no edema Female genitalia: normal external genitalia, vulva, vagina, cervix, uterus and adnexa. Cervical polyp noted Chaperone present   ASSESSMENT/PLAN:   LLQ pain Patient with intermittent LLQ pain. Differentials include diverticulosis, will refer to GI for possible colonoscopy. Can consider pregnancy, however pregnancy test was negative in office. Recent intercourse so can consider repeat at f/u. Can consider ovarian pathology, however, no tenderness to palpation of adnexa. Can consider pelvic at f/u appointment if continued pain. Can consider mittelschmerz pain as well, patient has irregular periods so difficult to tell if this is related to her menses. Follow up in 1 month.      [place], DO [facility]

## 2019-06-05 NOTE — Patient Instructions (Signed)

## 2019-06-05 NOTE — Assessment & Plan Note (Signed)
Patient with intermittent LLQ pain. Differentials include diverticulosis, will refer to GI for possible colonoscopy. Can consider pregnancy, however pregnancy test was negative in office. Recent intercourse so can consider repeat at f/u. Can consider ovarian pathology, however, no tenderness to palpation of adnexa. Can consider pelvic US at f/u appointment if continued pain. Can consider mittelschmerz pain as well, patient has irregular periods so difficult to tell if this is related to her menses. Follow up in 1 month.

## 2019-06-06 ENCOUNTER — Telehealth: Payer: Self-pay | Admitting: *Deleted

## 2019-06-06 LAB — CERVICOVAGINAL ANCILLARY ONLY
Chlamydia: NEGATIVE
Comment: NEGATIVE
Comment: NORMAL
Neisseria Gonorrhea: NEGATIVE

## 2019-06-06 NOTE — Telephone Encounter (Signed)
-----   Message from Oralia Manis, DO sent at 06/05/2019 11:56 AM EDT ----- Please inform patient that results of wet prep and pregnancy test are negative.

## 2019-06-06 NOTE — Telephone Encounter (Signed)
Pt informed. Lemont Sitzmann, CMA  

## 2019-06-09 ENCOUNTER — Telehealth: Payer: Self-pay | Admitting: *Deleted

## 2019-06-09 NOTE — Telephone Encounter (Signed)
Pt informed. Carolene Gitto, CMA  

## 2019-06-09 NOTE — Telephone Encounter (Signed)
-----   Message from Oralia Manis, DO sent at 06/08/2019 10:48 PM EDT ----- Please inform patient that results of GC/chlamydia are negative.

## 2019-06-10 ENCOUNTER — Encounter: Payer: Self-pay | Admitting: Gastroenterology

## 2019-07-03 ENCOUNTER — Ambulatory Visit (INDEPENDENT_AMBULATORY_CARE_PROVIDER_SITE_OTHER): Payer: Self-pay | Admitting: Gastroenterology

## 2019-07-03 ENCOUNTER — Encounter: Payer: Self-pay | Admitting: Gastroenterology

## 2019-07-03 VITALS — BP 120/86 | HR 86 | Ht 62.0 in | Wt 137.2 lb

## 2019-07-03 DIAGNOSIS — R102 Pelvic and perineal pain: Secondary | ICD-10-CM

## 2019-07-03 DIAGNOSIS — R1032 Left lower quadrant pain: Secondary | ICD-10-CM

## 2019-07-03 NOTE — Progress Notes (Signed)
Reviewed and agree with management plans. Proceed with colonoscopy if imaging is nondiagnostic.   Verne Lanuza L. Orvan Falconer, MD, MPH

## 2019-07-03 NOTE — Progress Notes (Signed)
07/03/2019 Betty Bates [id number] 1980-10-08   HISTORY OF PRESENT ILLNESS: This is a 39 year old female who is new to our office.  She was referred here by Dr. McDiarmid for evaluation regarding left lower quadrant abdominal pain.  The patient tells me that the pain has been present for about the past 6 months or so.  Initially she just ignored it and figured that it would go away, but it continues to come and go.  Recently has been coming more frequently.  Describes it as a pressing/pressure type pain about a 4 out of 10 on the pain scale at its worst.  It has woken her from sleep at night on occasion, however.  PCPs note said they would consider an ultrasound, but referred to GI for possible colonoscopy to rule out diverticular disease.  No evaluation has been done at this point.  She denies any issues with moving her bowels, no constipation or diarrhea.  Denies any rectal bleeding.  She says that she was having some burning epigastric discomfort several months ago, but stopped eating spicy foods and that has gone away.  She denies any nausea, vomiting, fevers, chills.  Has family history of uterine cancer in her mother.  Patient does not follow with a gynecologist regularly.   Past Medical History:  Diagnosis Date  . Medical history non-contributory    Past Surgical History:  Procedure Laterality Date  . NO PAST SURGERIES      reports that she has never smoked. She has never used smokeless tobacco. She reports that she does not drink alcohol and does not use drugs. family history includes Cancer in her maternal aunt; Cancer (age of onset: 49) in her mother; Diabetes in her paternal uncle. No Known Allergies    Outpatient Encounter Medications as of 07/03/2019  Medication Sig  . acyclovir (ZOVIRAX) 400 MG tablet Take 1 tablet (400 mg total) by mouth 3 (three) times daily. Take for 5 days.  Marland Kitchen ibuprofen (ADVIL,MOTRIN) 600 MG tablet Take 1 tablet (600 mg total) by mouth every 6  (six) hours.  . simethicone (GAS-X) 80 MG chewable tablet Chew 1 tablet (80 mg total) by mouth every 6 (six) hours as needed (gas pain).   No facility-administered encounter medications on file as of 07/03/2019.     REVIEW OF SYSTEMS  : All other systems reviewed and negative except where noted in the History of Present Illness.   PHYSICAL EXAM: BP 120/86   Pulse 86   Ht 5\' 2"  (1.575 m)   Wt 137 lb 3.2 oz (62.2 kg)   BMI 25.09 kg/m  General: Well developed Hispanic female in no acute distress Head: Normocephalic and atraumatic Eyes:  Sclerae anicteric, conjunctiva pink. Ears: Normal auditory acuity Lungs: Clear throughout to auscultation; no increased WOB. Heart: Regular rate and rhythm; no M/R/G. Abdomen: Soft, non-distended.  BS present.  Mild LLQ TTP. Musculoskeletal: Symmetrical with no gross deformities  Skin: No lesions on visible extremities Extremities: No edema  Neurological: Alert oriented x 4, grossly non-focal Psychological:  Alert and cooperative. Normal mood and affect  ASSESSMENT AND PLAN: *39 year old female with complaints of intermittent left lower quadrant abdominal pain x6 months.  Has woken her up from sleep on occasion.  At its worst it is about 4/10 on the pain scale.  PCP has not done any imaging.  They said they would consider ultrasound, but sent her here first to consider colonoscopy to rule out diverticular disease.  She has no bowel complaints.  I think that an imaging study such as ultrasound or CT scan would be more appropriate as initial evaluation.  CT scan may be more revealing as it will evaluate more structures.  We will plan for CT scan of the pelvis.   CC:  McDiarmid, Leighton Roach, MD

## 2019-07-03 NOTE — Patient Instructions (Signed)
If you are age 40 or older, your body mass index should be between 23-30. Your Body mass index is 25.09 kg/m. If this is out of the aforementioned range listed, please consider follow up with your Primary Care Provider.  If you are age 41 or younger, your body mass index should be between 19-25. Your Body mass index is 25.09 kg/m. If this is out of the aformentioned range listed, please consider follow up with your Primary Care Provider.   It has been recommended to you by your physician that you have a(n) CT scan completed. Per your request, we did not schedule the procedure(s) today. Please contact our office at [phone] should you decide to have the procedure completed. Ask to speak with Herbert Seta when you are ready to schedule.

## 2019-10-22 ENCOUNTER — Ambulatory Visit: Payer: Self-pay | Admitting: Internal Medicine

## 2019-10-22 ENCOUNTER — Encounter: Payer: Self-pay | Admitting: Internal Medicine

## 2019-10-22 VITALS — BP 104/78 | HR 68 | Resp 12 | Ht 62.25 in | Wt 135.0 lb

## 2019-10-22 DIAGNOSIS — Z1231 Encounter for screening mammogram for malignant neoplasm of breast: Secondary | ICD-10-CM

## 2019-10-22 DIAGNOSIS — R1032 Left lower quadrant pain: Secondary | ICD-10-CM

## 2019-10-22 NOTE — Progress Notes (Signed)
Subjective:    Patient ID: Betty Bates, female   DOB: Nov 01, 1980, 39 y.o.   MRN: [id number]   HPI   Here to establish  1.  LLQ Pain:  Started in January of 2021.  Intermittent.  Describes a pressure pushing in.  No radiation.  Will have this anywhere from 4 days to 2 weeks.  No worsening of pain.  Can be asleep or doing anything and comes on.  No change in intensity.  Never bad enough to take something for the discomfort.  Once the discomfort starts, it is no intermittent.  And then just goes away.   At most, has had episodes twice monthly but can go a month without any episode as happened this past month.   She has not noted any relation to her menstrual cycle--has not really though about it though. Her cycles are not regular and she states they never have. Menarche age 51.     When was not using any hormonal contraceptive when younger, her periods would last 2 weeks. Had Nexplanon removed Nov of 2020  She states she had it for 3 years.   She never had this discomfort before Jan 2021.   Currently, Periods can be every 30-40 days and last 7 days. States she has been this way since soon after Nexplanon removed. G2P2, both NVD postdates.  No activity, not eating, passing a BM, urinating, intercourse or any other physical activity seems to affect the discomfort in any way.  She never took any meds to see if would help. No melena or hematochezia.  No hematuria.  No N or V.  No vaginal discharge.       No outpatient medications have been marked as taking for the 10/22/19 encounter (Office Visit) with Julieanne Manson, MD.   No Known Allergies  Past Medical History:  Diagnosis Date  . Medical history non-contributory    Past Surgical History:  Procedure Laterality Date  . NO PAST SURGERIES     Family History  Problem Relation Age of Onset  . Cancer Mother 66       Uterine cancer  . Cancer Maternal Aunt 30       breast--cause of death  . Diabetes Paternal Uncle      Social History   Socioeconomic History  . Marital status: Married    Spouse name: Beryle Beams  . Number of children: 2  . Years of education: Not on file  . Highest education level: Bachelor's degree (e.g., BA, AB, BS)  Occupational History  . Occupation: Homemaker  Tobacco Use  . Smoking status: Never Smoker  . Smokeless tobacco: Never Used  Vaping Use  . Vaping Use: Never used  Substance and Sexual Activity  . Alcohol use: No  . Drug use: No  . Sexual activity: Yes    Comment: With husband who has herpes  Other Topics Concern  . Not on file  Social History Narrative   Lives at home with husband and 2 children.     He is often out of town with work Pension scheme manager.   Social Determinants of Health   Financial Resource Strain: Low Risk   . Difficulty of Paying Living Expenses: Not hard at all  Food Insecurity: No Food Insecurity  . Worried About Programme researcher, broadcasting/film/video in the Last Year: Never true  . Ran Out of Food in the Last Year: Never true  Transportation Needs: No Transportation Needs  . Lack of Transportation (Medical): No  .  Lack of Transportation (Non-Medical): No  Physical Activity:   . Days of Exercise per Week: Not on file  . Minutes of Exercise per Session: Not on file  Stress: No Stress Concern Present  . Feeling of Stress : Not at all  Social Connections: Moderately Isolated  . Frequency of Communication with Friends and Family: Once a week  . Frequency of Social Gatherings with Friends and Family: Never  . Attends Religious Services: 1 to 4 times per year  . Active Member of Clubs or Organizations: No  . Attends Banker Meetings: Never  . Marital Status: Married  Catering manager Violence: Not At Risk  . Fear of Current or Ex-Partner: No  . Emotionally Abused: No  . Physically Abused: No  . Sexually Abused: No       Review of Systems    Objective:   BP 104/78 (BP Location: Left Arm, Patient Position: Sitting, Cuff Size: Normal)    Pulse 68   Resp 12   Ht 5' 2.25" (1.581 m)   Wt 135 lb (61.2 kg)   BMI 24.49 kg/m   Physical Exam  NAD Neck:  Supple, No adenopathy Chest:  CTA CV:  RRR with normal S1 and S2, No S3, S4 or murmur.  Radial pulses normal and equal Abd:  S, very mild tenderness almost up against inside wall of left pelvis.  No rebound or peritoneal signs.  No HSM or mass, + BS GU:  Normal external female genitalia.  Moderate dark blood in vaginal canal.  No other discharge noted.  No vaginal or cervical lesion.  No uterine or adnexal mass or tenderness. Assessment & Plan   1.  LLQ or pelvic pain:  Ultrasound.  To pay attention to dates when she starts her period and when she has the pain and document.  2.  HM:  Influenza script for [facility].  Mammogram

## 2019-10-22 NOTE — Progress Notes (Signed)
Social work Theatre manager met with new patient who is scheduled with Dr. Amil Amen for medical visit. Social worker completed New Patient Questionnaire which included completion of housing, intimate partner violence, transportation needs, stress, Emergency planning/management officer strain, food insecurity and screeners. Social History   Socioeconomic History   Marital status: Single    Spouse name: Not on file   Number of children: Not on file   Years of education: Not on file   Highest education level: Not on file  Occupational History   Not on file  Tobacco Use   Smoking status: Never Smoker   Smokeless tobacco: Never Used  Substance and Sexual Activity   Alcohol use: No   Drug use: No   Sexual activity: Yes    Comment: With husband who has herpes  Other Topics Concern   Not on file  Social History Narrative   Not on file   Social Determinants of Health   Financial Resource Strain: Low Risk    Difficulty of Paying Living Expenses: Not hard at all  Food Insecurity: No Food Insecurity   Worried About Charity fundraiser in the Last Year: Never true   [place] in the Last Year: Never true  Transportation Needs: No Transportation Needs   Lack of Transportation (Medical): No   Lack of Transportation (Non-Medical): No  Physical Activity:    Days of Exercise per Week: Not on file   Minutes of Exercise per Session: Not on file  Stress: No Stress Concern Present   Feeling of Stress : Not at all  Social Connections: Moderately Isolated   Frequency of Communication with Friends and Family: Once a week   Frequency of Social Gatherings with Friends and Family: Never   Attends Religious Services: 1 to 4 times per year   Active Member of [employer] or Organizations: No   Attends Archivist Meetings: Never   Marital Status: Married    Depression screen [facility] 2/9 10/22/2019 10/22/2019 06/05/2019 11/14/2018 10/15/2018  Decreased Interest 0 0 0 0 0  Down, Depressed, Hopeless 0  0 0 0 0  PHQ - 2 Score 0 0 0 0 0  Altered sleeping 1 1 - - -  Tired, decreased energy 1 1 - - -  Change in appetite 1 1 - - -  Feeling bad or failure about yourself  0 0 - - -  Trouble concentrating 0 0 - - -  Moving slowly or fidgety/restless 0 0 - - -  Suicidal thoughts 0 0 - - -  PHQ-9 Score 3 3 - - -  Difficult doing work/chores Somewhat difficult Somewhat difficult - - -    GAD 7 : Generalized Anxiety Score 10/22/2019  Nervous, Anxious, on Edge 1  Control/stop worrying 0  Worry too much - different things 1  Trouble relaxing 1  Restless 0  Easily annoyed or irritable 0  Afraid - awful might happen 0  Total GAD 7 Score 3  Anxiety Difficulty Somewhat difficult     Based on presentation Recommended counseling. Patient discussed that she has racing thoughts before bedtime. Patient mentions that her stress comes occasionally because her husband travels for work and it is difficult to get tasks done at home by herself. Patient also discusses that she has had a poor appetite lately but she is unsure why. Social Work Theatre manager offered counseling to the patient. Patient seemed hesitant and asked if she could think about it. Social Work Theatre manager provided contact card with information about  how to get in touch if she decides to do counseling. No appointment scheduled at this time. Elenore Rota, MSW Intern

## 2019-10-24 ENCOUNTER — Other Ambulatory Visit: Payer: Self-pay

## 2019-10-24 DIAGNOSIS — R1032 Left lower quadrant pain: Secondary | ICD-10-CM

## 2019-10-24 DIAGNOSIS — Z13220 Encounter for screening for lipoid disorders: Secondary | ICD-10-CM

## 2019-10-25 LAB — HEPATIC FUNCTION PANEL
ALT: 19 IU/L (ref 0–32)
AST: 19 IU/L (ref 0–40)
Albumin: 4.5 g/dL (ref 3.8–4.8)
Alkaline Phosphatase: 50 IU/L (ref 44–121)
Bilirubin Total: 0.7 mg/dL (ref 0.0–1.2)
Bilirubin, Direct: 0.15 mg/dL (ref 0.00–0.40)
Total Protein: 7 g/dL (ref 6.0–8.5)

## 2019-10-25 LAB — CBC WITH DIFFERENTIAL/PLATELET
Basophils Absolute: 0 10*3/uL (ref 0.0–0.2)
Basos: 1 %
EOS (ABSOLUTE): 0.3 10*3/uL (ref 0.0–0.4)
Eos: 7 %
Hematocrit: 46 % (ref 34.0–46.6)
Hemoglobin: 15.1 g/dL (ref 11.1–15.9)
Immature Grans (Abs): 0 10*3/uL (ref 0.0–0.1)
Immature Granulocytes: 0 %
Lymphocytes Absolute: 1.7 10*3/uL (ref 0.7–3.1)
Lymphs: 35 %
MCH: 30.6 pg (ref 26.6–33.0)
MCHC: 32.8 g/dL (ref 31.5–35.7)
MCV: 93 fL (ref 79–97)
Monocytes Absolute: 0.3 10*3/uL (ref 0.1–0.9)
Monocytes: 7 %
Neutrophils Absolute: 2.5 10*3/uL (ref 1.4–7.0)
Neutrophils: 50 %
RBC: 4.94 x10E6/uL (ref 3.77–5.28)
RDW: 12.8 % (ref 11.7–15.4)
WBC: 4.9 10*3/uL (ref 3.4–10.8)

## 2019-10-25 LAB — LIPID PANEL W/O CHOL/HDL RATIO
Cholesterol, Total: 183 mg/dL (ref 100–199)
HDL: 46 mg/dL (ref >39)
LDL Chol Calc (NIH): 119 mg/dL — ABNORMAL HIGH (ref 0–99)
Triglycerides: 97 mg/dL (ref 0–149)
VLDL Cholesterol Cal: 18 mg/dL (ref 5–40)

## 2021-07-05 ENCOUNTER — Other Ambulatory Visit: Payer: Self-pay | Admitting: Internal Medicine

## 2021-07-05 ENCOUNTER — Ambulatory Visit: Payer: Self-pay | Admitting: Internal Medicine

## 2021-07-05 ENCOUNTER — Encounter: Payer: Self-pay | Admitting: Internal Medicine

## 2021-07-05 VITALS — BP 100/64 | HR 64 | Resp 12 | Ht 63.25 in | Wt 139.0 lb

## 2021-07-05 DIAGNOSIS — R1032 Left lower quadrant pain: Secondary | ICD-10-CM

## 2021-07-05 DIAGNOSIS — Z124 Encounter for screening for malignant neoplasm of cervix: Secondary | ICD-10-CM

## 2021-07-05 DIAGNOSIS — Z0000 Encounter for general adult medical examination without abnormal findings: Secondary | ICD-10-CM

## 2021-07-05 DIAGNOSIS — N841 Polyp of cervix uteri: Secondary | ICD-10-CM

## 2021-07-05 DIAGNOSIS — Z1231 Encounter for screening mammogram for malignant neoplasm of breast: Secondary | ICD-10-CM

## 2021-07-05 NOTE — Progress Notes (Unsigned)
Subjective:    Patient ID: Betty Bates, female   DOB: 1980-10-25, 41 y.o.   MRN: [id number]   HPI  Here after almost 2 year hiatus.  CPE with pap  1.  Pap:  Last 09/2018 and normal.  Always normal.    2.  Mammogram:  Never.  Maternal aunt with history of breast cancer.  Diagnosed in her 59s.  Cannot say for certain how old at death, but years after diagnosis.    3.  Osteoprevention:  Drinks 1 cup milk daily.  Walks regularly.  Willing to increase milk intake to 3 servings daily.    4.  Guaiac Cards/FIT:  Never.   5.  Colonoscopy:  Never.  No family history of colon cancer.    6.  Immunizations:  Not clear if willing to consider bivalent COVID booster.    7.  Glucose/Cholesterol:  Blood glucose normal in past.  Cholesterol acceptable.     No outpatient medications have been marked as taking for the 07/05/21 encounter (Office Visit) with Julieanne Manson, MD.   No Known Allergies  Past Medical History:  Diagnosis Date   Chronic LLQ pain    Medical history non-contributory    Past Surgical History:  Procedure Laterality Date   NO PAST SURGERIES     Family History  Problem Relation Age of Onset   Diabetes Mother    Cancer Mother 46       Uterine cancer   Breast cancer Maternal Aunt    Cancer Maternal Aunt 30       breast--cause of death   Diabetes Paternal Uncle    Social History   Socioeconomic History   Marital status: Married    Spouse name: Beryle Beams   Number of children: 2   Years of education: Not on file   Highest education level: Bachelor's degree (e.g., BA, AB, BS)  Occupational History   Occupation: Homemaker   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  Tobacco Use   Smoking status: Never   Smokeless tobacco: Never  Vaping Use   Vaping Use: Never used  Substance and Sexual Activity   Alcohol use: Yes    Comment: occasional.   Drug use: No   Sexual activity: Yes    Birth control/protection: Condom    Comment: With  husband who has herpes.  He takes suppression antivirals.  They use condoms.  Other Topics Concern   Not on file  Social History Narrative   Lives at home with husband and 2 children.     He is often out of town with work Pension scheme manager.   Social Determinants of Health   Financial Resource Strain: Low Risk  (07/05/2021)   Overall Financial Resource Strain (CARDIA)    Difficulty of Paying Living Expenses: Not hard at all  Food Insecurity: No Food Insecurity (12/29/2021)   Hunger Vital Sign    Worried About Running Out of Food in the Last Year: Never true    Ran Out of Food in the Last Year: Never true  Transportation Needs: No Transportation Needs (12/29/2021)   PRAPARE - Administrator, Civil Service (Medical): No    Lack of Transportation (Non-Medical): No  Physical Activity: Not on file  Stress: No Stress Concern Present (10/22/2019)   [employer] of Occupational Health - Occupational Stress Questionnaire    Feeling of Stress : Not at all  Social Connections: Moderately Isolated (10/22/2019)   Social Connection and Isolation Panel [NHANES]  Frequency of Communication with Friends and Family: Once a week    Frequency of Social Gatherings with Friends and Family: Never    Attends Religious Services: 1 to 4 times per year    Active Member of [employer] or Organizations: No    Attends Banker Meetings: Never    Marital Status: Married  Catering manager Violence: Not At Risk (07/05/2021)   Humiliation, Afraid, Rape, and Kick questionnaire    Fear of Current or Ex-Partner: No    Emotionally Abused: No    Physically Abused: No    Sexually Abused: No      Review of Systems    Objective:   BP 100/64 (BP Location: Right Arm, Patient Position: Sitting, Cuff Size: Normal)   Pulse 64   Resp 12   Ht 5' 3.25" (1.607 m)   Wt 139 lb (63 kg)   LMP 06/12/2021 (Within Days)   BMI 24.43 kg/m   Physical Exam   Assessment & Plan   ***

## 2021-07-06 LAB — CBC WITH DIFFERENTIAL/PLATELET
Basophils Absolute: 0 10*3/uL (ref 0.0–0.2)
Basos: 0 %
EOS (ABSOLUTE): 0.3 10*3/uL (ref 0.0–0.4)
Eos: 4 %
Hematocrit: 42.7 % (ref 34.0–46.6)
Hemoglobin: 14.3 g/dL (ref 11.1–15.9)
Immature Grans (Abs): 0 10*3/uL (ref 0.0–0.1)
Immature Granulocytes: 0 %
Lymphocytes Absolute: 1.9 10*3/uL (ref 0.7–3.1)
Lymphs: 25 %
MCH: 30.8 pg (ref 26.6–33.0)
MCHC: 33.5 g/dL (ref 31.5–35.7)
MCV: 92 fL (ref 79–97)
Monocytes Absolute: 0.5 10*3/uL (ref 0.1–0.9)
Monocytes: 6 %
Neutrophils Absolute: 4.9 10*3/uL (ref 1.4–7.0)
Neutrophils: 65 %
RBC: 4.64 x10E6/uL (ref 3.77–5.28)
RDW: 12.8 % (ref 11.7–15.4)
WBC: 7.6 10*3/uL (ref 3.4–10.8)

## 2021-07-06 LAB — COMPREHENSIVE METABOLIC PANEL
ALT: 16 IU/L (ref 0–32)
AST: 13 IU/L (ref 0–40)
Albumin/Globulin Ratio: 2 (ref 1.2–2.2)
Albumin: 4.3 g/dL (ref 3.8–4.8)
Alkaline Phosphatase: 45 IU/L (ref 44–121)
BUN/Creatinine Ratio: 22 (ref 9–23)
BUN: 12 mg/dL (ref 6–24)
Bilirubin Total: 0.4 mg/dL (ref 0.0–1.2)
CO2: 24 mmol/L (ref 20–29)
Calcium: 8.6 mg/dL — ABNORMAL LOW (ref 8.7–10.2)
Chloride: 104 mmol/L (ref 96–106)
Creatinine, Ser: 0.55 mg/dL — ABNORMAL LOW (ref 0.57–1.00)
Globulin, Total: 2.1 g/dL (ref 1.5–4.5)
Glucose: 92 mg/dL (ref 70–99)
Potassium: 3.9 mmol/L (ref 3.5–5.2)
Sodium: 143 mmol/L (ref 134–144)
Total Protein: 6.4 g/dL (ref 6.0–8.5)
eGFR: 117 mL/min/{1.73_m2} (ref >59)

## 2021-07-06 LAB — LIPID PANEL W/O CHOL/HDL RATIO
Cholesterol, Total: 150 mg/dL (ref 100–199)
HDL: 47 mg/dL (ref >39)
LDL Chol Calc (NIH): 84 mg/dL (ref 0–99)
Triglycerides: 105 mg/dL (ref 0–149)
VLDL Cholesterol Cal: 19 mg/dL (ref 5–40)

## 2021-07-07 LAB — CYTOLOGY - PAP

## 2021-07-14 ENCOUNTER — Other Ambulatory Visit: Payer: Self-pay | Admitting: Obstetrics and Gynecology

## 2021-07-14 DIAGNOSIS — Z1231 Encounter for screening mammogram for malignant neoplasm of breast: Secondary | ICD-10-CM

## 2021-08-18 ENCOUNTER — Ambulatory Visit: Payer: No Typology Code available for payment source | Admitting: *Deleted

## 2021-08-18 ENCOUNTER — Ambulatory Visit
Admission: RE | Admit: 2021-08-18 | Discharge: 2021-08-18 | Disposition: A | Discharge status: Home / Self Care | Payer: Self-pay | Source: Ambulatory Visit | Attending: Obstetrics and Gynecology | Admitting: Obstetrics and Gynecology

## 2021-08-18 VITALS — BP 98/64 | Wt 138.5 lb

## 2021-08-18 DIAGNOSIS — Z1231 Encounter for screening mammogram for malignant neoplasm of breast: Secondary | ICD-10-CM

## 2021-08-18 DIAGNOSIS — Z1239 Encounter for other screening for malignant neoplasm of breast: Secondary | ICD-10-CM

## 2021-08-18 NOTE — Patient Instructions (Signed)
Explained breast self awareness with Billie Ruddy Compean. Patient did not need a Pap smear today due to last Pap smear was 07/05/2021. Let her know [facility] will cover Pap smears every 3 years unless has a history of abnormal Pap smears. Referred patient to the [facility] of [facility] for a screening mammogram on mobile unit. Appointment scheduled Thursday, August 18, 2021 at 1520. Patient aware of appointment and will be there. Let patient know the Breast Center will follow up with her within the next couple weeks with results of her mammogram by letter or phone. Billie Ruddy Compean verbalized understanding.  Jamisen Hawes, Kathaleen Maser, RN 2:43 PM

## 2021-08-18 NOTE — Progress Notes (Signed)
Ms. Betty Bates is a 41 y.o. female who presents to [facility] clinic today with no complaints.    Pap Smear: Pap smear not completed today. Last Pap smear was 07/05/2021 at [facility] clinic and was normal. Per patient has no history of an abnormal Pap smear. Last Pap smear result is available in Epic.   Physical exam: Breasts Breasts symmetrical. No skin abnormalities bilateral breasts. No nipple retraction bilateral breasts. No nipple discharge bilateral breasts. No lymphadenopathy. No lumps palpated bilateral breasts. No complaints of pain or tenderness on exam.     Pelvic/Bimanual Pap is not indicated today per B[facility]CP guidelines.   Smoking History: Patient has never smoked.   Patient Navigation: Patient education provided. Access to services provided for patient through [facility] program.    Breast and Cervical Cancer Risk Assessment: Patient has family history of her maternal aunt having breast cancer. Patient has no known genetic mutations or history of radiation treatment to the chest before age 29. Patient does not have history of cervical dysplasia, immunocompromised, or DES exposure in-utero.  Risk Assessment     Risk Scores       08/18/2021   Last edited by: Narda Rutherford, LPN   5-year risk: 0.5 %   Lifetime risk: 8.5 %            A: [facility] exam without pap smear No complaints.  P: Referred patient to the [facility] of [facility] for a screening mammogram on mobile unit. Appointment scheduled Thursday, August 18, 2021 at 1520.  Priscille Heidelberg, RN 08/18/2021 2:43 PM

## 2021-08-22 ENCOUNTER — Other Ambulatory Visit: Payer: Self-pay | Admitting: Obstetrics and Gynecology

## 2021-08-22 DIAGNOSIS — R928 Other abnormal and inconclusive findings on diagnostic imaging of breast: Secondary | ICD-10-CM

## 2021-09-09 ENCOUNTER — Ambulatory Visit
Admission: RE | Admit: 2021-09-09 | Discharge: 2021-09-09 | Disposition: A | Discharge status: Home / Self Care | Payer: Self-pay | Source: Ambulatory Visit | Attending: Obstetrics and Gynecology | Admitting: Obstetrics and Gynecology

## 2021-09-09 DIAGNOSIS — R928 Other abnormal and inconclusive findings on diagnostic imaging of breast: Secondary | ICD-10-CM

## 2021-12-25 ENCOUNTER — Encounter: Payer: Self-pay | Admitting: Internal Medicine

## 2021-12-29 ENCOUNTER — Ambulatory Visit (INDEPENDENT_AMBULATORY_CARE_PROVIDER_SITE_OTHER): Payer: Self-pay | Admitting: Obstetrics and Gynecology

## 2021-12-29 ENCOUNTER — Encounter: Payer: Self-pay | Admitting: Obstetrics and Gynecology

## 2021-12-29 VITALS — BP 136/78 | HR 86 | Wt 142.7 lb

## 2021-12-29 DIAGNOSIS — N86 Erosion and ectropion of cervix uteri: Secondary | ICD-10-CM

## 2021-12-29 DIAGNOSIS — R1012 Left upper quadrant pain: Secondary | ICD-10-CM

## 2021-12-29 NOTE — Patient Instructions (Signed)
Ultrasound scheduling phone number: [phone]

## 2021-12-29 NOTE — Progress Notes (Signed)
NEW GYNECOLOGY PATIENT Patient name: Betty Bates MRN [id number]  Date of birth: 1980-09-11 Chief Complaint:   Abdominal Pain     History:  Betty Bates is a 41 y.o. G2P2002 being seen today for LLQ pain .    Polyp on cervix about 12 years ago that was burned and told that there was nothing wrong.   No abnormal pap smears in the past. No abnormal bleeding or abnormal discharge. Denies postcoital spotting, and intermenstrual spotting.  LLQ pain 2-3 years, unrelated to menses. Not related to any particular food. Maybe once woken by the pain, but has noticed feeling it as night. Nothing painful enough to take medication for. Pain is like a pinching sensation. Pain is there only briefly and then disappears. Will last for 1-2 weeks at a time. The pain is never severe when it occurs. No unintentional weight changes, no changes in stool caliber, urination habits, weight, bleeding pattern, vaginal discharge, denies early satiety, denies nausea vomiting, denies persistent abdominal pain.  No family history of ovarian cancer, colon cancer, or stomach cancer.  Denies abdominal bloating and reflux.    Not currently insured and concerned about cost of care today and for future visits.   Gynecologic History Patient's last menstrual period was 12/05/2021 (approximate). Contraception: abstinence Last Pap: 06/2021. Result was NILM Last Mammogram: 08/2021.  Result was normal  Obstetric History OB History  Gravida Para Term Preterm AB Living  2 2 2  0 0 2  SAB IAB Ectopic Multiple Live Births  0 0 0 0 2    # Outcome Date GA Lbr Len/2nd Weight Sex Delivery Anes PTL Lv  2 Term 01/17/13 [redacted]w[redacted]d 02:05 / 00:25 7 lb 12 oz (3.515 kg) F Vag-Spont Local  LIV  1 Term 07/12/08 [redacted]w[redacted]d  8 lb 12 oz (3.969 kg) F Vag-Spont None N LIV    Past Medical History:  Diagnosis Date   Chronic LLQ pain    Medical history non-contributory     Past Surgical History:  Procedure Laterality Date   NO PAST  SURGERIES      No current outpatient medications on file prior to visit.   No current facility-administered medications on file prior to visit.    No Known Allergies  Social History:  reports that she has never smoked. She has never used smokeless tobacco. She reports current alcohol use. She reports that she does not use drugs.  Family History  Problem Relation Age of Onset   Diabetes Mother    Cancer Mother 60       Uterine cancer   Breast cancer Maternal Aunt    Cancer Maternal Aunt 30       breast--cause of death   Diabetes Paternal Uncle     The following portions of the patient's history were reviewed and updated as appropriate: allergies, current medications, past family history, past medical history, past social history, past surgical history and problem list.  Review of Systems Pertinent items noted in HPI and remainder of comprehensive ROS otherwise negative.  Physical Exam:  BP 136/78   Pulse 86   Wt 142 lb 11.2 oz (64.7 kg)   LMP 12/05/2021 (Approximate)   BMI 25.08 kg/m  Physical Exam Vitals and nursing note reviewed. Exam conducted with a chaperone present.  Constitutional:      Appearance: Normal appearance.  Cardiovascular:     Rate and Rhythm: Normal rate.  Pulmonary:     Effort: Pulmonary effort is normal.     Breath  sounds: Normal breath sounds.  Abdominal:     General: Bowel sounds are normal.     Palpations: Abdomen is soft.     Tenderness: There is no abdominal tenderness.  Genitourinary:    General: Normal vulva.     Exam position: Lithotomy position.     Vagina: Normal.     Comments: No defined polyp, likely ectroprion visualized - see photo   Neurological:     General: No focal deficit present.     Mental Status: She is alert and oriented to person, place, and time.  Psychiatric:        Mood and Affect: Mood normal.        Behavior: Behavior normal.        Thought Content: Thought content normal.        Judgment: Judgment normal.       Assessment and Plan:   1. Left upper quadrant abdominal pain Discussed symptoms not concerning for pelvic origin of pain  Possible abdominal wall muscle spasm Will apply for financial assistance and schedule [place] once established CMP wnl  Encourage daily multivitamin with calcium - US Abdomen Complete; Future  2. Ectropion of cervix Normal pap hx Cervical exam unchanged (see photo today and photo in 10/15/2018 w/ PCP) If new symptoms arise will biopsy and obtain ultrasound   Routine preventative health maintenance measures emphasized. Please refer to After Visit Summary for other counseling recommendations.   Follow-up: No follow-ups on file.      Lorriane Shire, MD Obstetrician & Gynecologist, Faculty Practice [facility]nec[facility] for [employer], [facility]

## 2022-07-06 ENCOUNTER — Encounter: Payer: Self-pay | Admitting: Internal Medicine

## 2022-07-06 ENCOUNTER — Ambulatory Visit: Payer: Self-pay | Admitting: Internal Medicine

## 2022-07-06 VITALS — BP 110/70 | HR 64 | Resp 16 | Ht 62.75 in | Wt 138.0 lb

## 2022-07-06 DIAGNOSIS — L292 Pruritus vulvae: Secondary | ICD-10-CM

## 2022-07-06 DIAGNOSIS — Z0000 Encounter for general adult medical examination without abnormal findings: Secondary | ICD-10-CM

## 2022-07-06 DIAGNOSIS — Z1159 Encounter for screening for other viral diseases: Secondary | ICD-10-CM

## 2022-07-06 DIAGNOSIS — Z1231 Encounter for screening mammogram for malignant neoplasm of breast: Secondary | ICD-10-CM

## 2022-07-06 LAB — POCT WET PREP WITH KOH
KOH Prep POC: NEGATIVE
Trichomonas, UA: NEGATIVE
Yeast Wet Prep HPF POC: NEGATIVE

## 2022-07-06 MED ORDER — METRONIDAZOLE 500 MG PO TABS: ORAL_TABLET | ORAL | 0 refills | Status: DC

## 2022-07-06 NOTE — Progress Notes (Signed)
Subjective:    Patient ID: Betty Bates, female   DOB: 1980-05-20, 42 y.o.   MRN: [id number]   HPI  CPE without pap  1.  Pap:  Last 06/2021 and normal.  Felt to have a cervical polyp with 06/2021 CPE, but evaluated by Gyn and felt to be extropion only.    2.  Mammogram:  Last in 08/2021 with compression view bilaterally with [place].  Felt to show fibrocystic changes in superior right breast and no concerning findings on left.    3.  Osteoprevention:  Close to 2 servings daily of milk/cheese.  Biking and volleyball at times.  Not physically active daily.  4.  Guaiac Cards/FIT:  Has never returned specimen to clinic.  5.  Colonoscopy:  Never.  No family history of colon cancer.    6.  Immunizations:  Had two covid vaccines and she does not recall further vaccines   Needs Td updated.  7.  Glucose/Cholesterol:  fine last year.   Lipid Panel     Component Value Date/Time   CHOL 150 07/05/2021 1218   TRIG 105 07/05/2021 1218   HDL 47 07/05/2021 1218   CHOLHDL 5.4 04/24/2014 1630   VLDL 24 04/24/2014 1630   LDLCALC 84 07/05/2021 1218   LABVLDL 19 07/05/2021 1218     No outpatient medications have been marked as taking for the 07/06/22 encounter (Office Visit) with Julieanne Manson, MD.   No Known Allergies  Past Medical History:  Diagnosis Date   Chronic LLQ pain    Past Surgical History:  Procedure Laterality Date   NO PAST SURGERIES     Family History  Problem Relation Age of Onset   Diabetes Mother    Cancer Mother 42       Uterine cancer   Breast cancer Maternal Aunt    Cancer Maternal Aunt 30       breast--cause of death   Diabetes Paternal Uncle    Social History   Socioeconomic History   Marital status: Married    Spouse name: Beryle Beams   Number of children: 2   Years of education: Not on file   Highest education level: Bachelor's degree (e.g., BA, AB, BS)  Occupational History   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  Tobacco Use   Smoking status: Never    Passive exposure: Never   Smokeless tobacco: Never  Vaping Use   Vaping Use: Never used  Substance and Sexual Activity   Alcohol use: Yes    Comment: once weekly   Drug use: No   Sexual activity: Yes    Birth control/protection: Condom    Comment: With husband who has herpes.  He takes suppression antivirals.  They use condoms.  Other Topics Concern   Not on file  Social History Narrative   Lives at home with husband and 2 children.     He is often out of town with work Pension scheme manager.   Has a bachelor's degree in software development   Social Determinants of Health   Financial Resource Strain: Low Risk  (07/05/2021)   Overall Financial Resource Strain (CARDIA)    Difficulty of Paying Living Expenses: Not hard at all  Food Insecurity: No Food Insecurity (07/06/2022)   Hunger Vital Sign    Worried About Running Out of Food in the Last Year: Never true    Ran Out of Food in the Last Year: Never true  Transportation Needs: No Transportation Needs (  07/06/2022)   PRAPARE - Administrator, Civil Service (Medical): No    Lack of Transportation (Non-Medical): No  Physical Activity: Not on file  Stress: No Stress Concern Present (10/22/2019)   [employer] of Occupational Health - Occupational Stress Questionnaire    Feeling of Stress : Not at all  Social Connections: Moderately Isolated (10/22/2019)   Social Connection and Isolation Panel [NHANES]    Frequency of Communication with Friends and Family: Once a week    Frequency of Social Gatherings with Friends and Family: Never    Attends Religious Services: 1 to 4 times per year    Active Member of [employer] or Organizations: No    Attends Banker Meetings: Never    Marital Status: Married  Catering manager Violence: Not At Risk (07/06/2022)   Humiliation, Afraid, Rape, and Kick questionnaire    Fear of Current or Ex-Partner: No    Emotionally Abused: No     Physically Abused: No    Sexually Abused: No      Review of Systems  Respiratory:  Negative for cough and shortness of breath.   Cardiovascular:  Negative for chest pain, palpitations and leg swelling.  Gastrointestinal:        LLQ discomfort much less frequent and bothersome.  Felt to be muscular by Gyn.  Genitourinary:        No cervical polyp found on exam with gyn in 12/2021.  She never underwent US.  Not clear she applied for financial assistance. Some external vulvar itching in past few months.  No odor or discharge.  Only at night.  Her husband has told her she is scratching in her sleep at times. No bleeding with the scratching. She does not have the itching during the day. Wears stretch pant like shorts over her underwear to bed.        Objective:   BP 110/70 (BP Location: Left Arm, Patient Position: Sitting, Cuff Size: Normal)   Pulse 64   Resp 16   Ht 5' 2.75" (1.594 m)   Wt 138 lb (62.6 kg)   BMI 24.64 kg/m   Physical Exam Constitutional:      Appearance: Normal appearance.  HENT:     Head: Normocephalic and atraumatic.     Right Ear: Tympanic membrane, ear canal and external ear normal.     Left Ear: Tympanic membrane, ear canal and external ear normal.     Nose: Nose normal.     Mouth/Throat:     Mouth: Mucous membranes are moist.     Pharynx: Oropharynx is clear.  Eyes:     Extraocular Movements: Extraocular movements intact.     Pupils: Pupils are equal, round, and reactive to light.     Comments: Discs sharp  Neck:     Thyroid: No thyroid mass or thyromegaly.  Cardiovascular:     Rate and Rhythm: Normal rate and regular rhythm.     Heart sounds: S1 normal and S2 normal. No murmur heard.    No friction rub. No S3 or S4 sounds.     Comments: No carotid bruits.  Carotid, radial, femoral, DP and PT pulses normal and equal.   Pulmonary:     Breath sounds: Normal breath sounds and air entry.  Chest:  Breasts:    Right: No inverted nipple, mass  or nipple discharge.     Left: No inverted nipple, mass or nipple discharge.  Abdominal:     General: Bowel sounds are normal.  Palpations: Abdomen is soft. There is no hepatomegaly, splenomegaly or mass.     Tenderness: There is no abdominal tenderness.     Hernia: No hernia is present.  Genitourinary:    Comments: Normal external female genitalia Vulvar area very moist, with some scant brown/old blood Swab for wet prep taken Musculoskeletal:        General: Normal range of motion.     Cervical back: Normal range of motion and neck supple.     Right lower leg: No edema.     Left lower leg: No edema.  Lymphadenopathy:     Head:     Right side of head: No submental or submandibular adenopathy.     Left side of head: No submental or submandibular adenopathy.     Cervical: No cervical adenopathy.     Upper Body:     Right upper body: No supraclavicular or axillary adenopathy.     Left upper body: No supraclavicular or axillary adenopathy.     Lower Body: No right inguinal adenopathy. No left inguinal adenopathy.  Skin:    General: Skin is warm.     Capillary Refill: Capillary refill takes less than 2 seconds.     Findings: No lesion or rash.  Neurological:     General: No focal deficit present.     Mental Status: She is alert and oriented to person, place, and time.     Cranial Nerves: Cranial nerves 2-12 are intact.     Sensory: Sensation is intact.     Motor: Motor function is intact.     Coordination: Coordination is intact.     Gait: Gait is intact.     Deep Tendon Reflexes: Reflexes are normal and symmetric.  Psychiatric:        Mood and Affect: Mood normal.        Speech: Speech normal.        Behavior: Behavior normal. Behavior is cooperative.      Assessment & Plan   CPE without pap Mammogram for August. FIT to return in 2 weeks. Declined COVID booster. Td in 2 weeks as well as out today. Encouraged influenza vaccine in fall--to call in September for clinic  dates.   CBC, CMP, FLP, Hep C screen today.    2.  Vulvar itching.  Mild BV on wet prep.  Metronidazole 500 mg twice daily for 7 days.  No ETOH when taking.  Call if no improvement.  3.  LLQ pain:   really not a large issue now.  She will apply for orange card and can send of ultrasound

## 2022-07-07 LAB — LIPID PANEL W/O CHOL/HDL RATIO
Cholesterol, Total: 176 mg/dL (ref 100–199)
HDL: 47 mg/dL (ref >39)
LDL Chol Calc (NIH): 116 mg/dL — ABNORMAL HIGH (ref 0–99)
Triglycerides: 67 mg/dL (ref 0–149)
VLDL Cholesterol Cal: 13 mg/dL (ref 5–40)

## 2022-07-07 LAB — CBC WITH DIFFERENTIAL/PLATELET
Basophils Absolute: 0 10*3/uL (ref 0.0–0.2)
Basos: 0 %
EOS (ABSOLUTE): 0.3 10*3/uL (ref 0.0–0.4)
Eos: 6 %
Hematocrit: 43.3 % (ref 34.0–46.6)
Hemoglobin: 14.3 g/dL (ref 11.1–15.9)
Immature Grans (Abs): 0 10*3/uL (ref 0.0–0.1)
Immature Granulocytes: 0 %
Lymphocytes Absolute: 1.6 10*3/uL (ref 0.7–3.1)
Lymphs: 34 %
MCH: 30.6 pg (ref 26.6–33.0)
MCHC: 33 g/dL (ref 31.5–35.7)
MCV: 93 fL (ref 79–97)
Monocytes Absolute: 0.3 10*3/uL (ref 0.1–0.9)
Monocytes: 7 %
Neutrophils Absolute: 2.4 10*3/uL (ref 1.4–7.0)
Neutrophils: 53 %
RBC: 4.68 x10E6/uL (ref 3.77–5.28)
RDW: 12.4 % (ref 11.7–15.4)
WBC: 4.7 10*3/uL (ref 3.4–10.8)

## 2022-07-07 LAB — COMPREHENSIVE METABOLIC PANEL
ALT: 12 IU/L (ref 0–32)
AST: 16 IU/L (ref 0–40)
Albumin: 4.3 g/dL (ref 3.9–4.9)
Alkaline Phosphatase: 43 IU/L — ABNORMAL LOW (ref 44–121)
BUN/Creatinine Ratio: 34 — ABNORMAL HIGH (ref 9–23)
BUN: 20 mg/dL (ref 6–24)
Bilirubin Total: 0.5 mg/dL (ref 0.0–1.2)
CO2: 22 mmol/L (ref 20–29)
Calcium: 8.6 mg/dL — ABNORMAL LOW (ref 8.7–10.2)
Chloride: 108 mmol/L — ABNORMAL HIGH (ref 96–106)
Creatinine, Ser: 0.58 mg/dL (ref 0.57–1.00)
Globulin, Total: 2.2 g/dL (ref 1.5–4.5)
Glucose: 89 mg/dL (ref 70–99)
Potassium: 4.1 mmol/L (ref 3.5–5.2)
Sodium: 144 mmol/L (ref 134–144)
Total Protein: 6.5 g/dL (ref 6.0–8.5)
eGFR: 115 mL/min/{1.73_m2} (ref >59)

## 2022-07-07 LAB — HEPATITIS C ANTIBODY: Hep C Virus Ab: NONREACTIVE

## 2022-07-24 ENCOUNTER — Ambulatory Visit (INDEPENDENT_AMBULATORY_CARE_PROVIDER_SITE_OTHER): Payer: Self-pay | Admitting: Internal Medicine

## 2022-07-24 DIAGNOSIS — Z23 Encounter for immunization: Secondary | ICD-10-CM

## 2022-07-24 NOTE — Progress Notes (Signed)
Discussed recent labs were all fine.

## 2022-09-28 ENCOUNTER — Ambulatory Visit
Admission: RE | Admit: 2022-09-28 | Discharge: 2022-09-28 | Disposition: A | Discharge status: Home / Self Care | Payer: No Typology Code available for payment source | Source: Ambulatory Visit | Attending: Internal Medicine | Admitting: Internal Medicine

## 2022-09-28 DIAGNOSIS — Z1231 Encounter for screening mammogram for malignant neoplasm of breast: Secondary | ICD-10-CM

## 2022-10-27 ENCOUNTER — Telehealth: Payer: Self-pay

## 2022-10-27 NOTE — Telephone Encounter (Signed)
Patient needs an appointment for a dental referral. Patient needs referral for cleaning.  Patient also stated she already has a tooth filling and would like to get that checked. Patient does not have pain.  We will call if there is a cancellation.

## 2022-10-31 ENCOUNTER — Ambulatory Visit (HOSPITAL_COMMUNITY)
Admission: EM | Admit: 2022-10-31 | Discharge: 2022-10-31 | Disposition: A | Discharge status: Home / Self Care | Payer: No Typology Code available for payment source

## 2022-10-31 ENCOUNTER — Encounter (HOSPITAL_COMMUNITY): Payer: Self-pay

## 2022-10-31 DIAGNOSIS — J309 Allergic rhinitis, unspecified: Secondary | ICD-10-CM

## 2022-10-31 NOTE — ED Triage Notes (Signed)
Patient here today with c/o itchy throat, ST, and cough when she has the sensation of her scratchy throat X 3 weeks. She has been taking Vics cold and flu and some cough drops with no relief.

## 2022-10-31 NOTE — Discharge Instructions (Addendum)
Start taking Zyrtec and using Flonase nasal spray daily to help with symptoms.  If symptoms persist you can return here for reevaluation or follow-up with primary care doctor.

## 2022-10-31 NOTE — ED Provider Notes (Signed)
[facility]    CSN: [id number] Arrival date & time: 10/31/22  1813      History   Chief Complaint Chief Complaint  Patient presents with   Sore Throat    HPI Betty Bates is a 42 y.o. female.   Patient presents with itchy throat, congestion, and mild cough x 3 weeks.  Patient reports taking Vicks cold and flu and cough drops with mild relief.  Denies shortness of breath, fever, or trouble swallowing.   Sore Throat Pertinent negatives include no shortness of breath.    Past Medical History:  Diagnosis Date   Chronic LLQ pain     Patient Active Problem List   Diagnosis Date Noted   Pelvic pain in female 07/03/2019   LLQ abdominal pain 06/05/2019   Cervical polyp 11/14/2018   Abnormal uterine bleeding (AUB) 11/14/2018   Hypoactive sexual desire 04/24/2014   Rash of genital area 04/25/2013   General counseling and advice on female contraception 04/25/2013    Past Surgical History:  Procedure Laterality Date   NO PAST SURGERIES      OB History     Gravida  2   Para  2   Term  2   Preterm  0   AB  0   Living  2      SAB  0   IAB  0   Ectopic  0   Multiple  0   Live Births  2            Home Medications    Prior to Admission medications   Not on File    Family History Family History  Problem Relation Age of Onset   Diabetes Mother    Cancer Mother 58       Uterine cancer   Breast cancer Maternal Aunt    Cancer Maternal Aunt 30       breast--cause of death   Diabetes Paternal Uncle     Social History Social History   Tobacco Use   Smoking status: Never    Passive exposure: Never   Smokeless tobacco: Never  Vaping Use   Vaping status: Never Used  Substance Use Topics   Alcohol use: Yes    Comment: once weekly   Drug use: No     Allergies   Patient has no known allergies.   Review of Systems Review of Systems  Constitutional:  Negative for chills, fatigue and fever.  HENT:  Positive for  congestion, rhinorrhea and sore throat. Negative for trouble swallowing.   Respiratory:  Positive for cough. Negative for shortness of breath.      Physical Exam Triage Vital Signs ED Triage Vitals  Encounter Vitals Group     BP 10/31/22 1848 129/83     Systolic BP Percentile --      Diastolic BP Percentile --      Pulse Rate 10/31/22 1848 71     Resp 10/31/22 1848 16     Temp 10/31/22 1848 98.3 F (36.8 C)     Temp Source 10/31/22 1848 Oral     SpO2 10/31/22 1848 98 %     Weight 10/31/22 1848 138 lb (62.6 kg)     Height 10/31/22 1848 5\' 2"  (1.575 m)     Head Circumference --      Peak Flow --      Pain Score 10/31/22 1846 0     Pain Loc --      Pain Education --  Exclude from Growth Chart --    No data found.  Updated Vital Signs BP 129/83 (BP Location: Right Arm)   Pulse 71   Temp 98.3 F (36.8 C) (Oral)   Resp 16   Ht 5\' 2"  (1.575 m)   Wt 138 lb (62.6 kg)   LMP 10/24/2022 (Approximate)   SpO2 98%   BMI 25.24 kg/m   Visual Acuity Right Eye Distance:   Left Eye Distance:   Bilateral Distance:    Right Eye Near:   Left Eye Near:    Bilateral Near:     Physical Exam Vitals and nursing note reviewed.  Constitutional:      General: She is awake. She is not in acute distress.    Appearance: Normal appearance. She is well-developed and well-groomed. She is not ill-appearing.  HENT:     Right Ear: Tympanic membrane and external ear normal.     Left Ear: Tympanic membrane and external ear normal.     Ears:     Comments: Mild erythema noted to bilateral ear canals.    Nose: Congestion and rhinorrhea present.     Right Sinus: No maxillary sinus tenderness or frontal sinus tenderness.     Left Sinus: No maxillary sinus tenderness or frontal sinus tenderness.     Mouth/Throat:     Mouth: Mucous membranes are moist.     Pharynx: Posterior oropharyngeal erythema and postnasal drip present. No pharyngeal swelling or oropharyngeal exudate.     Tonsils: No  tonsillar exudate.  Neurological:     Mental Status: She is alert.  Psychiatric:        Behavior: Behavior is cooperative.      UC Treatments / Results  Labs (all labs ordered are listed, but only abnormal results are displayed) Labs Reviewed - No data to display  EKG   Radiology No results found.  Procedures Procedures (including critical care time)  Medications Ordered in UC Medications - No data to display  Initial Impression / Assessment and Plan / UC Course  I have reviewed the triage vital signs and the nursing notes.  Pertinent labs & imaging results that were available during my care of the patient were reviewed by me and considered in my medical decision making (see chart for details).     Patient presented with 3-week history of itchy throat, congestion, and mild cough.  Upon assessment patient has mild erythema and postnasal drip to oropharynx, congestion and rhinorrhea are present.  Patient has mild erythema noted to bilateral ear canals without swelling or abnormal TMs.  Recommended Zyrtec and Flonase for allergic rhinitis.  Discussed follow-up and return precautions. Final Clinical Impressions(s) / UC Diagnoses   Final diagnoses:  Allergic rhinitis, unspecified seasonality, unspecified trigger     Discharge Instructions      Start taking Zyrtec and using Flonase nasal spray daily to help with symptoms.  If symptoms persist you can return here for reevaluation or follow-up with primary care doctor.    ED Prescriptions   None    PDMP not reviewed this encounter.   Wynonia Lawman A, NP 10/31/22 [phone]

## 2022-11-13 NOTE — Telephone Encounter (Signed)
Called to offer appointment 11/14/22, but patient unable to make it.

## 2022-12-04 NOTE — Telephone Encounter (Signed)
Called patient to offer appointment, patient did not answer.

## 2023-02-12 NOTE — Telephone Encounter (Signed)
Patient has been scheduled for 02/14/2023.

## 2023-02-14 ENCOUNTER — Ambulatory Visit: Payer: Self-pay | Admitting: Internal Medicine

## 2023-11-13 ENCOUNTER — Telehealth: Payer: Self-pay

## 2023-11-13 NOTE — Telephone Encounter (Signed)
 Patient telephoned and requested mammogram scholarship application. Confirmed patient's address. [facility]

## 2023-11-30 ENCOUNTER — Telehealth: Payer: Self-pay | Admitting: General Practice

## 2023-12-17 ENCOUNTER — Other Ambulatory Visit: Payer: Self-pay | Admitting: Internal Medicine

## 2023-12-17 ENCOUNTER — Encounter: Payer: Self-pay | Admitting: Internal Medicine

## 2023-12-17 DIAGNOSIS — Z1231 Encounter for screening mammogram for malignant neoplasm of breast: Secondary | ICD-10-CM

## 2024-01-01 ENCOUNTER — Ambulatory Visit
Admission: RE | Admit: 2024-01-01 | Discharge: 2024-01-01 | Disposition: A | Payer: Self-pay | Source: Ambulatory Visit | Attending: Internal Medicine | Admitting: Internal Medicine

## 2024-01-01 DIAGNOSIS — Z1231 Encounter for screening mammogram for malignant neoplasm of breast: Secondary | ICD-10-CM

## 2024-06-18 ENCOUNTER — Encounter: Payer: Self-pay | Admitting: Internal Medicine
